# Patient Record
Sex: Female | Born: 1970
Health system: Southern US, Community
[De-identification: ages and names within clinical notes are randomized; demographics above are authoritative.]

## PROBLEM LIST (undated history)

## (undated) DIAGNOSIS — R112 Nausea with vomiting, unspecified: Secondary | ICD-10-CM

## (undated) DIAGNOSIS — R011 Cardiac murmur, unspecified: Secondary | ICD-10-CM

## (undated) DIAGNOSIS — K1379 Other lesions of oral mucosa: Secondary | ICD-10-CM

## (undated) DIAGNOSIS — M2550 Pain in unspecified joint: Secondary | ICD-10-CM

## (undated) DIAGNOSIS — R768 Other specified abnormal immunological findings in serum: Secondary | ICD-10-CM

## (undated) DIAGNOSIS — Z8619 Personal history of other infectious and parasitic diseases: Secondary | ICD-10-CM

## (undated) DIAGNOSIS — I7300 Raynaud's syndrome without gangrene: Secondary | ICD-10-CM

## (undated) DIAGNOSIS — N920 Excessive and frequent menstruation with regular cycle: Secondary | ICD-10-CM

## (undated) DIAGNOSIS — Z8701 Personal history of pneumonia (recurrent): Secondary | ICD-10-CM

## (undated) DIAGNOSIS — E785 Hyperlipidemia, unspecified: Secondary | ICD-10-CM

## (undated) DIAGNOSIS — Z9289 Personal history of other medical treatment: Secondary | ICD-10-CM

## (undated) DIAGNOSIS — N946 Dysmenorrhea, unspecified: Secondary | ICD-10-CM

## (undated) DIAGNOSIS — M329 Systemic lupus erythematosus, unspecified: Secondary | ICD-10-CM

## (undated) DIAGNOSIS — Z973 Presence of spectacles and contact lenses: Secondary | ICD-10-CM

## (undated) DIAGNOSIS — G8929 Other chronic pain: Secondary | ICD-10-CM

## (undated) DIAGNOSIS — N840 Polyp of corpus uteri: Secondary | ICD-10-CM

## (undated) DIAGNOSIS — Z8719 Personal history of other diseases of the digestive system: Secondary | ICD-10-CM

## (undated) DIAGNOSIS — Z8249 Family history of ischemic heart disease and other diseases of the circulatory system: Secondary | ICD-10-CM

## (undated) DIAGNOSIS — Z8709 Personal history of other diseases of the respiratory system: Secondary | ICD-10-CM

## (undated) DIAGNOSIS — R7689 Other specified abnormal immunological findings in serum: Secondary | ICD-10-CM

## (undated) DIAGNOSIS — Z9889 Other specified postprocedural states: Secondary | ICD-10-CM

## (undated) HISTORY — DX: Raynaud's syndrome without gangrene: I73.00

## (undated) HISTORY — PX: OTHER SURGICAL HISTORY: SHX169

---

## 1998-05-10 ENCOUNTER — Other Ambulatory Visit: Admission: RE | Admit: 1998-05-10 | Discharge: 1998-05-10 | Payer: Self-pay | Admitting: Gynecology

## 1998-12-29 ENCOUNTER — Other Ambulatory Visit: Admission: RE | Admit: 1998-12-29 | Discharge: 1998-12-29 | Payer: Self-pay | Admitting: Obstetrics and Gynecology

## 2001-01-17 ENCOUNTER — Other Ambulatory Visit: Admission: RE | Admit: 2001-01-17 | Discharge: 2001-01-17 | Payer: Self-pay | Admitting: Obstetrics and Gynecology

## 2001-05-27 ENCOUNTER — Ambulatory Visit (HOSPITAL_COMMUNITY): Admission: RE | Admit: 2001-05-27 | Discharge: 2001-05-27 | Payer: Self-pay | Admitting: Obstetrics and Gynecology

## 2001-06-30 ENCOUNTER — Encounter: Payer: Self-pay | Admitting: Obstetrics and Gynecology

## 2001-06-30 ENCOUNTER — Inpatient Hospital Stay (HOSPITAL_COMMUNITY): Admission: AD | Admit: 2001-06-30 | Discharge: 2001-06-30 | Payer: Self-pay | Admitting: Obstetrics and Gynecology

## 2001-08-04 ENCOUNTER — Inpatient Hospital Stay (HOSPITAL_COMMUNITY): Admission: AD | Admit: 2001-08-04 | Discharge: 2001-08-09 | Payer: Self-pay | Admitting: Obstetrics and Gynecology

## 2001-09-19 ENCOUNTER — Other Ambulatory Visit: Admission: RE | Admit: 2001-09-19 | Discharge: 2001-09-19 | Payer: Self-pay | Admitting: Obstetrics and Gynecology

## 2002-10-08 ENCOUNTER — Other Ambulatory Visit: Admission: RE | Admit: 2002-10-08 | Discharge: 2002-10-08 | Payer: Self-pay | Admitting: Obstetrics and Gynecology

## 2003-06-20 ENCOUNTER — Emergency Department (HOSPITAL_COMMUNITY): Admission: AD | Admit: 2003-06-20 | Discharge: 2003-06-20 | Payer: Self-pay | Admitting: Family Medicine

## 2003-06-22 ENCOUNTER — Ambulatory Visit (HOSPITAL_COMMUNITY): Admission: RE | Admit: 2003-06-22 | Discharge: 2003-06-22 | Payer: Self-pay | Admitting: Family Medicine

## 2004-11-29 ENCOUNTER — Other Ambulatory Visit: Admission: RE | Admit: 2004-11-29 | Discharge: 2004-11-29 | Payer: Self-pay | Admitting: Obstetrics and Gynecology

## 2005-11-28 ENCOUNTER — Ambulatory Visit (HOSPITAL_COMMUNITY): Admission: RE | Admit: 2005-11-28 | Discharge: 2005-11-28 | Payer: Self-pay | Admitting: Emergency Medicine

## 2005-11-28 ENCOUNTER — Emergency Department (HOSPITAL_COMMUNITY): Admission: EM | Admit: 2005-11-28 | Discharge: 2005-11-28 | Payer: Self-pay | Admitting: Emergency Medicine

## 2007-09-25 ENCOUNTER — Emergency Department (HOSPITAL_COMMUNITY): Admission: EM | Admit: 2007-09-25 | Discharge: 2007-09-25 | Payer: Self-pay | Admitting: Emergency Medicine

## 2009-03-14 ENCOUNTER — Encounter: Payer: Self-pay | Admitting: Pulmonary Disease

## 2009-03-24 ENCOUNTER — Ambulatory Visit: Payer: Self-pay | Admitting: Pulmonary Disease

## 2009-03-24 DIAGNOSIS — J209 Acute bronchitis, unspecified: Secondary | ICD-10-CM

## 2009-07-09 DIAGNOSIS — I7300 Raynaud's syndrome without gangrene: Secondary | ICD-10-CM

## 2009-07-09 HISTORY — DX: Raynaud's syndrome without gangrene: I73.00

## 2010-05-16 ENCOUNTER — Ambulatory Visit (HOSPITAL_COMMUNITY): Admission: RE | Admit: 2010-05-16 | Discharge: 2010-05-16 | Payer: Self-pay | Admitting: Family Medicine

## 2010-05-22 ENCOUNTER — Encounter (HOSPITAL_COMMUNITY)
Admission: RE | Admit: 2010-05-22 | Discharge: 2010-06-21 | Payer: Self-pay | Source: Home / Self Care | Attending: Family Medicine | Admitting: Family Medicine

## 2010-06-07 ENCOUNTER — Ambulatory Visit (HOSPITAL_COMMUNITY): Admission: RE | Admit: 2010-06-07 | Discharge: 2010-06-07 | Payer: Self-pay | Admitting: General Surgery

## 2010-06-07 HISTORY — PX: LAPAROSCOPIC CHOLECYSTECTOMY: SUR755

## 2010-07-14 ENCOUNTER — Ambulatory Visit (HOSPITAL_COMMUNITY)
Admission: RE | Admit: 2010-07-14 | Discharge: 2010-07-14 | Payer: Self-pay | Source: Home / Self Care | Attending: Rheumatology | Admitting: Rheumatology

## 2010-09-19 LAB — DIFFERENTIAL
Basophils Relative: 0 % (ref 0–1)
Eosinophils Absolute: 0.2 10*3/uL (ref 0.0–0.7)
Lymphs Abs: 3 10*3/uL (ref 0.7–4.0)
Monocytes Absolute: 0.6 10*3/uL (ref 0.1–1.0)
Monocytes Relative: 6 % (ref 3–12)

## 2010-09-19 LAB — BASIC METABOLIC PANEL
CO2: 28 mEq/L (ref 19–32)
Calcium: 9.4 mg/dL (ref 8.4–10.5)
Chloride: 105 mEq/L (ref 96–112)
GFR calc Af Amer: >60 mL/min (ref >60)
Glucose, Bld: 68 mg/dL — ABNORMAL LOW (ref 70–99)
Potassium: 4.1 mEq/L (ref 3.5–5.1)
Sodium: 140 mEq/L (ref 135–145)

## 2010-09-19 LAB — PROTIME-INR: Prothrombin Time: 12.5 seconds (ref 11.6–15.2)

## 2010-09-19 LAB — CBC
HCT: 38.9 % (ref 36.0–46.0)
Hemoglobin: 14.1 g/dL (ref 12.0–15.0)
MCH: 31.3 pg (ref 26.0–34.0)
MCHC: 36.2 g/dL — ABNORMAL HIGH (ref 30.0–36.0)
MCV: 86.3 fL (ref 78.0–100.0)
RBC: 4.51 MIL/uL (ref 3.87–5.11)

## 2010-09-19 LAB — SURGICAL PCR SCREEN
MRSA, PCR: NEGATIVE
Staphylococcus aureus: POSITIVE — AB

## 2010-11-13 ENCOUNTER — Institutional Professional Consult (permissible substitution): Payer: Self-pay | Admitting: Internal Medicine

## 2010-11-14 ENCOUNTER — Ambulatory Visit (INDEPENDENT_AMBULATORY_CARE_PROVIDER_SITE_OTHER): Payer: Self-pay | Admitting: Pulmonary Disease

## 2010-11-14 ENCOUNTER — Encounter: Payer: Self-pay | Admitting: Pulmonary Disease

## 2010-11-14 VITALS — BP 132/80 | HR 85 | Temp 98.5°F | Ht 67.0 in | Wt 240.8 lb

## 2010-11-14 DIAGNOSIS — R0609 Other forms of dyspnea: Secondary | ICD-10-CM

## 2010-11-14 DIAGNOSIS — R0989 Other specified symptoms and signs involving the circulatory and respiratory systems: Secondary | ICD-10-CM

## 2010-11-14 DIAGNOSIS — D6862 Lupus anticoagulant syndrome: Secondary | ICD-10-CM | POA: Insufficient documentation

## 2010-11-14 DIAGNOSIS — R0600 Dyspnea, unspecified: Secondary | ICD-10-CM | POA: Insufficient documentation

## 2010-11-14 NOTE — Assessment & Plan Note (Addendum)
I suspect the pts doe is related to her ongoing smoking and chronic airway inflammation.  She describes a day to day variability, and the ability to do anything on her good days.  The key for improvement will be smoking cessation.  Her obesity also plays a role in her symptoms.  That said, she has been diagnosed with autoimmune disease that may affect her lungs significantly.  I think she needs a v/q given her h/o lupus anticoagulant, full pfts to establish a baseline and to see if obstructive lung disease, cxr to r/o ISLD, and finally would consider an echo to evaluate for pulmonary htn.

## 2010-11-14 NOTE — Progress Notes (Signed)
  Subjective:    Patient ID: Jillian Cruz, female    DOB: [birth year], 40 y.o.   MRN: [id number]  HPI The pt is a 39y/o female who I have been asked to see for dyspnea.  She was seen in 2010 for an episode of acute asthmatic bronchitis related to her ongoing smoking, and unfortunately continues to do so.  She has been recently diagnosed with lupus, lupus anticoagulant, and possibly scleroderma (followed by Deveshwar).  She has been started on mtx and pred for this.  The pt continues to have issues with recurrent bronchitis, and notes a day to day variability in her dyspnea.  On some days she breathes well with excellent exertional tolerance, and others has chest tightness with significant doe.  She feels her breathing is no different from her last visit with me on "normal" days.  She can walk a very long distance without difficulty on her normal days.  She denies any LE edema, pleuritic cp, or h/o VTE.  She has not had a recent cxr.    Review of Systems  Constitutional: Positive for unexpected weight change. Negative for fever.  HENT: Negative for ear pain, nosebleeds, congestion, sore throat, rhinorrhea, sneezing, trouble swallowing, dental problem, postnasal drip and sinus pressure.   Eyes: Negative for redness and itching.  Respiratory: Positive for shortness of breath. Negative for cough and chest tightness.   Cardiovascular: Negative for palpitations and leg swelling.  Gastrointestinal: Negative for nausea and vomiting.  Genitourinary: Negative for dysuria.  Musculoskeletal: Positive for joint swelling.  Skin: Negative for rash.  Neurological: Negative for headaches.  Hematological: Does not bruise/bleed easily.  Psychiatric/Behavioral: Negative for dysphoric mood. The patient is not nervous/anxious.        Objective:   Physical Exam Obese female in nad Nares patent without discharge.  Mild crusting Op clear Chest totally clear to auscultation, no wheezing Cor with rrr LE without  edema, no cyanosis  Alert and oriented, moves all 4        Assessment & Plan:

## 2010-11-14 NOTE — Patient Instructions (Signed)
Will schedule for lung scan and cxr to evaluate for possible small blood clots Will schedule for breathing tests, and will all you with results Stop smoking.  This is critical given new diagnosis Work on weight loss Will arrange followup visit once testing done

## 2010-11-20 ENCOUNTER — Ambulatory Visit (HOSPITAL_COMMUNITY)
Admission: RE | Admit: 2010-11-20 | Discharge: 2010-11-20 | Disposition: A | Discharge status: Home / Self Care | Payer: 59 | Source: Ambulatory Visit | Attending: Pulmonary Disease | Admitting: Pulmonary Disease

## 2010-11-20 ENCOUNTER — Encounter (HOSPITAL_COMMUNITY)
Admission: RE | Admit: 2010-11-20 | Discharge: 2010-11-20 | Disposition: A | Discharge status: Home / Self Care | Payer: 59 | Source: Ambulatory Visit | Attending: Pulmonary Disease | Admitting: Pulmonary Disease

## 2010-11-20 ENCOUNTER — Other Ambulatory Visit (HOSPITAL_COMMUNITY): Payer: 59

## 2010-11-20 DIAGNOSIS — R0600 Dyspnea, unspecified: Secondary | ICD-10-CM

## 2010-11-20 DIAGNOSIS — F17200 Nicotine dependence, unspecified, uncomplicated: Secondary | ICD-10-CM | POA: Insufficient documentation

## 2010-11-20 DIAGNOSIS — R0609 Other forms of dyspnea: Secondary | ICD-10-CM | POA: Insufficient documentation

## 2010-11-20 DIAGNOSIS — R062 Wheezing: Secondary | ICD-10-CM | POA: Insufficient documentation

## 2010-11-20 DIAGNOSIS — R0989 Other specified symptoms and signs involving the circulatory and respiratory systems: Secondary | ICD-10-CM | POA: Insufficient documentation

## 2010-11-20 DIAGNOSIS — R0602 Shortness of breath: Secondary | ICD-10-CM | POA: Insufficient documentation

## 2010-11-20 DIAGNOSIS — M349 Systemic sclerosis, unspecified: Secondary | ICD-10-CM | POA: Insufficient documentation

## 2010-11-20 MED ORDER — XENON XE 133 GAS
5.5000 | GAS_FOR_INHALATION | Freq: Once | RESPIRATORY_TRACT | Status: AC | PRN
  Administered 2010-11-20: 6 via RESPIRATORY_TRACT

## 2010-11-20 MED ORDER — TECHNETIUM TO 99M ALBUMIN AGGREGATED
5.5000 | Freq: Once | INTRAVENOUS | Status: AC | PRN
  Administered 2010-11-20: 6 via INTRAVENOUS

## 2010-11-24 NOTE — Op Note (Signed)
[facility]  Patient:    Jillian Cruz, VENTURA Visit Number: [id number] MRN: [id number]          Service Type: OBS Location: 910A 9144 01 Attending Physician:  Marcelle Overlie Dictated by:   Trevor Iha, M.D. Proc. Date: 08/06/01 Admit Date:  08/04/2001                             Operative Report  PREOPERATIVE DIAGNOSES:       1. Intrauterine pregnancy at 39 weeks.                               2. Failed induction.                               3. Macrosomia by ultrasound.                               4. Desires primary low segment transverse                                  cesarean section.  POSTOPERATIVE DIAGNOSES:      1. Intrauterine pregnancy at 39 weeks.                               2. Failed induction.                               3. Macrosomia by ultrasound.                               4. Desires primary low segment transverse                                  cesarean section.  PROCEDURE:                    Primary low segment transverse cesarean section.  SURGEON:                      Trevor Iha, M.D.  ANESTHESIA:                   Spinal.  INDICATIONS:                  The patient is a 40 year old primigravida admitted on January 27 for two-stage induction due to 9 lb 1 oz infant by ultrasound.  She underwent Cytotec cervical ripening and Pitocin.  No cervical change.  She again had Cytotec cervical ripening.  Restarted Pitocin.  Again, no cervical change.  At this time, the patient was offered continued induction, primary cesarean section or even discharge to home with follow up in one week.  The patient and her husband adamantly desired primary cesarean section.  The risks and benefits were discussed and informed consent was obtained.  FINDINGS AT THE TIME OF SURGERY:          Viable female infant with Apgars of  9 and 9. Weight was 8 lb 3 oz.  DESCRIPTION OF PROCEDURE:     After adequate analgesia, the patient  was placed in the supine position with a left lateral tilt.  She was sterilely prepped and draped.  The bladder was sterilely drained with a Foley catheter.  A Pfannenstiel skin incision was made two fingerbreadths above the pubic symphysis and taken down sharply to the fascia.  The fascia was incised transversely and extended superiorly and inferiorly off the bellies of the rectus muscles.  The rectus muscles were separated sharply in the midline. The peritoneum was then entered sharply.  A bladder flap was created and place behind a bladder blade.  A low segment myotomy incision was made down to the infants vertex.  This was extended laterally with the operators fingertips. Amniotomy was performed.  The infants vertex was grasped and delivered with a vacuum extractor.  The nares and pharynx were suctioned.  The infant was then delivered and the cord clamped.  The infant was handed to the pediatricians. Cord blood was then obtained.  The placenta was extracted manually.  The uterus was exteriorized and wiped clean with a dry lap.  The myotomy incision was closed in two layers, the first being a running locking layer, the second being an imbricating layer.  After adequate hemostasis was assured, the uterus was placed back into the peritoneal cavity.  After a copious amount of irrigation, a small area in the myotomy incision required a figure-of-eight of 0 Monocryl for hemostasis.  Irrigation of the pelvis was applied.  After adequate hemostasis, the peritoneum was closed with 2-0 Vicryl in a running fashion.  The rectus muscles were plicated in the midline.  Irrigation was applied.  After adequate hemostasis, the fascia was closed in a single layer with 0 PDS.  Irrigation was applied.  After adequate hemostasis, the skin was stapled and Steri-Strips applied.  The patient tolerated the procedure well and was stable on transfer to the recovery room.  Sponge, needle and instrument counts was  normal x 3.  The patient received 1 g of cefotetan after delivery of the placenta.  Estimated blood loss was 550 cc. Dictated by:   Trevor Iha, M.D. Attending Physician:  Marcelle Overlie DD:  08/06/01 TD:  08/06/01 Job: [id number]

## 2010-11-24 NOTE — Discharge Summary (Signed)
[facility]  Patient:    Jillian Cruz, Jillian Cruz Visit Number: [id number] MRN: [id number]          Service Type: OBS Location: 910A 9144 01 Attending Physician:  Marcelle Overlie Dictated by:   Danie Chandler, R.N. Admit Date:  08/04/2001 Discharge Date: 08/09/2001                             Discharge Summary  ADMITTING DIAGNOSES:          1. Intrauterine pregnancy at [redacted] weeks gestation                                  with macrosomia by ultrasound for induction.  DISCHARGE DIAGNOSES:          1. Intrauterine pregnancy at [redacted] weeks gestation                                  with macrosomia by ultrasound for induction.                               2. Failed induction and desires primary low                                  segment transverse cesarean section.  PROCEDURE:                    On August 06, 2001, primary low segment transverse cesarean section.  REASON FOR ADMISSION:         The patient is a 40 year old primigravida who was admitted on January 27, for a two-stage induction due to a 9 pound 1 ounce infant by ultrasound.  The patient underwent Cytotec cervical ripening and Pitocin.  There was no cervical change.  She again had Cytotec cervical ripening.  The Pitocin was restarted.  Again, there was no cervical change. At that point, the patient was offered continued induction, primary cesarean section or even discharge to home with followup in one week.  The patient and her husband adamantly desired primary cesarean section.  HOSPITAL COURSE:              The patient was taken to the operating room and underwent the above-named procedure without complication.  This was productive of a viable female infant with Apgars of 9 at one minute and 9 at five minutes. Postoperatively, on day #1, the patient had good return of bowel function. She was without complaint.  Her hemoglobin was 9.0, hematocrit 25.8, and white blood cell count 11.3.  On  postoperative day #2, she had good pain control and was ambulating well without difficulty.  She was also tolerating a regular diet.  On postoperative day #3, she was discharged home.  CONDITION ON DISCHARGE:       Good.  DIET:                         Regular as tolerated.  ACTIVITY:                     No heavy lifting, no driving, no vaginal entry. She is to follow up in the  office in 1-2 weeks for incision check and she is to call for temperature greater than 100.0 degrees, persistent nausea or vomiting, heavy vaginal bleeding and/or redness or drainage from the incision site.  DISCHARGE MEDICATIONS:        1. Prenatal vitamin 1 p.o. q.d.                               2. Percocet 5 mg, #30, as directed by                                  medical doctor. Dictated by:   Danie Chandler, R.N. Attending Physician:  Marcelle Overlie DD:  08/21/01 TD:  08/21/01 Job: [id number]

## 2010-11-28 ENCOUNTER — Encounter: Payer: Self-pay | Admitting: Pulmonary Disease

## 2010-12-06 ENCOUNTER — Encounter: Payer: Self-pay | Admitting: Pulmonary Disease

## 2010-12-06 ENCOUNTER — Ambulatory Visit (INDEPENDENT_AMBULATORY_CARE_PROVIDER_SITE_OTHER): Payer: 59 | Admitting: Pulmonary Disease

## 2010-12-06 VITALS — BP 122/66 | HR 85 | Temp 98.4°F | Ht 67.0 in | Wt 239.0 lb

## 2010-12-06 DIAGNOSIS — R0600 Dyspnea, unspecified: Secondary | ICD-10-CM

## 2010-12-06 DIAGNOSIS — R0989 Other specified symptoms and signs involving the circulatory and respiratory systems: Secondary | ICD-10-CM

## 2010-12-06 LAB — PULMONARY FUNCTION TEST

## 2010-12-06 NOTE — Assessment & Plan Note (Signed)
The pt has doe with an isolated DLCO abnormality on pfts.  She has no evidence of chronic TE disease by v/q, and therefore needs echo to evaluate for pulmonary htn given her autoimmune disease.  If normal, would consider HRCT to r/o occult ISLD.  I have also stressed to her the importance of weight loss and smoking cessation.  I have told her that she does not have copd, but that she can get airway inflammation due to ongoing smoking that can interfere with her breathing as well.

## 2010-12-06 NOTE — Patient Instructions (Signed)
Will set up for sound wave test of heart to evaluate for pulmonary hypertension. Work on weight loss and smoking cessation.  Will call you with results from echo

## 2010-12-06 NOTE — Progress Notes (Signed)
  Subjective:    Patient ID: Jillian Cruz, female    DOB: 16-May-1971, 40 y.o.   MRN: [id number]  HPI The pt comes in today for f/u of her recent v/q scan and pfts, done as part of a w/u for doe.  Her v/q did not show any evidence for chronic PE, and her pfts were totally normal except an isolated DLCO abnl.  Her cxr did not show any evidence for ISLD.  I have reviewed the studies with her, and answered all of her questions.    Review of Systems  Constitutional: Negative for fever and unexpected weight change.  HENT: Negative for ear pain, nosebleeds, congestion, sore throat, rhinorrhea, sneezing, trouble swallowing, dental problem, postnasal drip and sinus pressure.   Eyes: Negative for redness and itching.  Respiratory: Negative for cough, chest tightness, shortness of breath and wheezing.   Cardiovascular: Positive for leg swelling. Negative for palpitations.  Gastrointestinal: Negative for nausea and vomiting.  Genitourinary: Negative for dysuria.  Musculoskeletal: Positive for joint swelling.  Skin: Negative for rash.  Neurological: Negative for headaches.  Hematological: Does not bruise/bleed easily.  Psychiatric/Behavioral: Negative for dysphoric mood. The patient is not nervous/anxious.        Objective:   Physical Exam Obese female in nad Chest clear to auscultation Cor with rrr LE without significant edema, no cyanosis  Alert, oriented, moves all 4        Assessment & Plan:

## 2010-12-06 NOTE — Progress Notes (Signed)
PFT done today. 

## 2010-12-08 ENCOUNTER — Ambulatory Visit (HOSPITAL_COMMUNITY): Payer: 59 | Attending: Pulmonary Disease | Admitting: Radiology

## 2010-12-08 DIAGNOSIS — R0600 Dyspnea, unspecified: Secondary | ICD-10-CM

## 2010-12-08 DIAGNOSIS — I059 Rheumatic mitral valve disease, unspecified: Secondary | ICD-10-CM | POA: Insufficient documentation

## 2010-12-08 DIAGNOSIS — R0602 Shortness of breath: Secondary | ICD-10-CM

## 2010-12-08 DIAGNOSIS — M7989 Other specified soft tissue disorders: Secondary | ICD-10-CM | POA: Insufficient documentation

## 2010-12-08 DIAGNOSIS — R0989 Other specified symptoms and signs involving the circulatory and respiratory systems: Secondary | ICD-10-CM | POA: Insufficient documentation

## 2010-12-08 DIAGNOSIS — F17200 Nicotine dependence, unspecified, uncomplicated: Secondary | ICD-10-CM | POA: Insufficient documentation

## 2010-12-08 DIAGNOSIS — R0609 Other forms of dyspnea: Secondary | ICD-10-CM | POA: Insufficient documentation

## 2010-12-08 HISTORY — PX: TRANSTHORACIC ECHOCARDIOGRAM: SHX275

## 2010-12-11 ENCOUNTER — Telehealth: Payer: Self-pay | Admitting: Pulmonary Disease

## 2010-12-11 NOTE — Telephone Encounter (Signed)
Pt calling for Echo results that were done on 6/1.  Please advise.  Thanks.

## 2010-12-12 NOTE — Telephone Encounter (Signed)
Will review and let pt know results.

## 2010-12-12 NOTE — Telephone Encounter (Signed)
Patient calling back for echo results  

## 2010-12-13 ENCOUNTER — Other Ambulatory Visit: Payer: Self-pay | Admitting: Pulmonary Disease

## 2010-12-13 DIAGNOSIS — R0600 Dyspnea, unspecified: Secondary | ICD-10-CM

## 2010-12-13 NOTE — Progress Notes (Signed)
Noted.  Will send order to [facility]

## 2010-12-19 ENCOUNTER — Ambulatory Visit (INDEPENDENT_AMBULATORY_CARE_PROVIDER_SITE_OTHER)
Admission: RE | Admit: 2010-12-19 | Discharge: 2010-12-19 | Disposition: A | Discharge status: Home / Self Care | Payer: 59 | Source: Ambulatory Visit | Attending: Pulmonary Disease | Admitting: Pulmonary Disease

## 2010-12-19 DIAGNOSIS — R0989 Other specified symptoms and signs involving the circulatory and respiratory systems: Secondary | ICD-10-CM

## 2010-12-19 DIAGNOSIS — R0600 Dyspnea, unspecified: Secondary | ICD-10-CM

## 2010-12-22 ENCOUNTER — Telehealth: Payer: Self-pay | Admitting: Pulmonary Disease

## 2010-12-22 NOTE — Telephone Encounter (Signed)
Spoke with pt and advised Jillian Cruz out of the office until Monday 6/18, but will forward him msg that she wants these results. Please advise thanks

## 2010-12-25 NOTE — Telephone Encounter (Signed)
Pt called again requesting same. Also c/o SOB since last night. "very anxious" re: what's going on. [phone]. Hazel Sams

## 2010-12-26 ENCOUNTER — Encounter: Payer: Self-pay | Admitting: Pulmonary Disease

## 2010-12-26 NOTE — Telephone Encounter (Signed)
Done.  See documentation.  

## 2010-12-26 NOTE — Telephone Encounter (Signed)
LMTCB- will forward back to [facility] so he can advise results.

## 2010-12-26 NOTE — Progress Notes (Signed)
This encounter was created in error - please disregard.

## 2010-12-26 NOTE — Telephone Encounter (Signed)
Pt returned call.  Pt states her "nerves are shot" and is requesting the results of her CT chest.  KC, please advise of results.

## 2011-01-04 ENCOUNTER — Encounter: Payer: Self-pay | Admitting: Pulmonary Disease

## 2011-02-08 ENCOUNTER — Telehealth: Payer: Self-pay | Admitting: Pulmonary Disease

## 2011-02-08 NOTE — Telephone Encounter (Signed)
Called spoke with patient who is requesting to know what her diagnosis is.  She states that when she spoke with [facility] she was told to quit smoking, but has been informed by her rheumatologist that he said she has COPD.  Pt is confused and requesting clarification.  Is also requesting to know her PFT values that were discussed at ov.  No mention of these in ov note.  Pt is aware KC is out of the office this afternoon and will return tomorrow and is okay with this.  KC please advise, thanks!

## 2011-02-09 NOTE — Telephone Encounter (Signed)
Let pt know that she has had a very thorough pulmonary evaluation, and there is no lung condition evident that is causing her sob.  She does NOT have COPD by her breathing tests!!  But she must stop smoking nevertheless.  That alone may help with her shortness of breath.   She does not have a pulmonary diagnosis.  If she remains confused about all of this, would be happy to see her for OV to discuss again.

## 2011-02-09 NOTE — Telephone Encounter (Signed)
Pt aware of kc's response and she is fine with this, pt states the rheumatologist had confused her and she says thank-you to dr clance for going back over this with her.

## 2011-02-09 NOTE — Telephone Encounter (Signed)
lmtcb

## 2011-04-02 LAB — CBC
Hemoglobin: 15.1 — ABNORMAL HIGH
MCHC: 35.6
MCV: 86.3
RBC: 4.91
RDW: 12.6

## 2011-04-02 LAB — DIFFERENTIAL
Basophils Absolute: 0.1
Basophils Relative: 1
Eosinophils Absolute: 0.2
Monocytes Absolute: 0.9
Monocytes Relative: 7
Neutro Abs: 8.8 — ABNORMAL HIGH
Neutrophils Relative %: 65

## 2011-04-02 LAB — URINALYSIS, ROUTINE W REFLEX MICROSCOPIC
Bilirubin Urine: NEGATIVE
Ketones, ur: NEGATIVE
Nitrite: NEGATIVE
Specific Gravity, Urine: 1.011
Urobilinogen, UA: 1

## 2011-04-02 LAB — I-STAT 8, (EC8 V) (CONVERTED LAB)
Acid-base deficit: 1
BUN: 6
Bicarbonate: 27.5 — ABNORMAL HIGH
Chloride: 106
Glucose, Bld: 64 — ABNORMAL LOW
HCT: 46
Potassium: 3.6
Sodium: 138
TCO2: 29

## 2011-04-02 LAB — POCT CARDIAC MARKERS
CKMB, poc: <1 — ABNORMAL LOW
Myoglobin, poc: 53.7
Troponin i, poc: <0.05

## 2012-08-25 ENCOUNTER — Other Ambulatory Visit: Payer: Self-pay | Admitting: Oral Surgery

## 2012-08-25 DIAGNOSIS — M26629 Arthralgia of temporomandibular joint, unspecified side: Secondary | ICD-10-CM

## 2012-08-27 ENCOUNTER — Encounter (INDEPENDENT_AMBULATORY_CARE_PROVIDER_SITE_OTHER): Payer: Self-pay | Admitting: *Deleted

## 2012-08-31 ENCOUNTER — Ambulatory Visit
Admission: RE | Admit: 2012-08-31 | Discharge: 2012-08-31 | Disposition: A | Discharge status: Home / Self Care | Payer: 59 | Source: Ambulatory Visit | Attending: Oral Surgery | Admitting: Oral Surgery

## 2012-09-01 ENCOUNTER — Encounter (INDEPENDENT_AMBULATORY_CARE_PROVIDER_SITE_OTHER): Payer: Self-pay | Admitting: *Deleted

## 2012-09-01 ENCOUNTER — Other Ambulatory Visit (INDEPENDENT_AMBULATORY_CARE_PROVIDER_SITE_OTHER): Payer: Self-pay | Admitting: *Deleted

## 2012-09-01 ENCOUNTER — Encounter (INDEPENDENT_AMBULATORY_CARE_PROVIDER_SITE_OTHER): Payer: Self-pay | Admitting: Internal Medicine

## 2012-09-01 ENCOUNTER — Ambulatory Visit (INDEPENDENT_AMBULATORY_CARE_PROVIDER_SITE_OTHER): Payer: 59 | Admitting: Internal Medicine

## 2012-09-01 VITALS — BP 130/66 | HR 80 | Temp 98.7°F | Ht 67.0 in | Wt 247.0 lb

## 2012-09-01 DIAGNOSIS — A048 Other specified bacterial intestinal infections: Secondary | ICD-10-CM | POA: Insufficient documentation

## 2012-09-01 NOTE — Patient Instructions (Addendum)
EGD with Dr. Mariana Single

## 2012-09-01 NOTE — Progress Notes (Signed)
Subjective:     Patient ID: Jillian Cruz, female   DOB: [birth year], 42 y.o.   MRN: [id number]  HPI Referred to our office by [facility]. Seen the end of January.  She tells me everytime she ate she would have nausea. Symptoms x 1 weeks.  Seen at [facility] and tested positive for H. Pylori. She was treated with Biaxin, Amoxicillin and PPI x 14 days. The nausea is better. There is no acid reflux.  Recently placed on Protonix. She tells me she is 80% better as far as the nausea is concerned. Appetite is good. No weight loss. No abdominal. She usually has a BM 1-2 a day. She alternates between constipation and diarrhea.  No melena or bright red rectal bleeding.  08/05/2012 H and H 13.8 and 40.7, Platelet ct 415. Tested positive for antibody for scleroderma. Hx Lupus.    CBC    Component Value Date/Time   WBC 8.9 06/06/2010 1510   RBC 4.51 06/06/2010 1510   HGB 14.1 06/06/2010 1510   HCT 38.9 06/06/2010 1510   PLT 372 06/06/2010 1510   MCV 86.3 06/06/2010 1510   MCH 31.3 06/06/2010 1510   MCHC 36.2* 06/06/2010 1510   RDW 12.2 06/06/2010 1510   LYMPHSABS 3.0 06/06/2010 1510   MONOABS 0.6 06/06/2010 1510   EOSABS 0.2 06/06/2010 1510   BASOSABS 0.0 06/06/2010 1510      05/16/2010 US abdomen: No acute findings. Echogenic liver consistent with fatty infiltration. 05/22/2010 HIDA scan: Normal biliary patency study. Low GB EF at 27%. Review of Systems see hpi Current Outpatient Prescriptions  Medication Sig Dispense Refill  . cholecalciferol (VITAMIN D) 1000 UNITS tablet Take 1,000 Units by mouth 2 (two) times daily before a meal.      . DULoxetine (CYMBALTA) 30 MG capsule Take 30 mg by mouth daily.      . hydroxychloroquine (PLAQUENIL) 200 MG tablet Take 200 mg by mouth 2 (two) times daily.        . pantoprazole (PROTONIX) 40 MG tablet Take 40 mg by mouth daily.       No current facility-administered medications for this visit.   Past Medical History  Diagnosis Date  . Lupus  2011  . Scleroderma 2011  . Raynaud disease 2011  No Known Allergies  Past Surgical History  Procedure Laterality Date  . Cesarean section  2003  . Gallbladder surgery  2011        Objective:   Physical Exam  Filed Vitals:   09/01/12 1504  BP: 130/66  Pulse: 80  Temp: 98.7 F (37.1 C)  Height: 5\' 7"  (1.702 m)  Weight: 247 lb (112.038 kg)   Alert and oriented. Skin warm and dry. Oral mucosa is moist.   . Sclera anicteric, conjunctivae is pink. Thyroid not enlarged. No cervical lymphadenopathy. Lungs clear. Heart regular rate and rhythm.  Abdomen is soft. Bowel sounds are positive. No hepatomegaly. No abdominal masses felt. No tenderness.  No edema to lower extremities.       Assessment:    H. Pylori +. Nausea and vomiting. PUD needs to be ruled out. Patient has been treated for H. Pylori.     Plan:    EGD with Dr. Karilyn Cota.The risks and benefits such as perforation, bleeding, and infection were reviewed with the patient and is agreeable.

## 2012-09-02 ENCOUNTER — Encounter (HOSPITAL_COMMUNITY): Payer: Self-pay | Admitting: Pharmacy Technician

## 2012-09-03 ENCOUNTER — Encounter (HOSPITAL_COMMUNITY)
Admission: RE | Disposition: A | Discharge status: Home / Self Care | Payer: Self-pay | Source: Ambulatory Visit | Attending: Internal Medicine

## 2012-09-03 ENCOUNTER — Encounter (HOSPITAL_COMMUNITY): Payer: Self-pay | Admitting: *Deleted

## 2012-09-03 ENCOUNTER — Ambulatory Visit (HOSPITAL_COMMUNITY)
Admission: RE | Admit: 2012-09-03 | Discharge: 2012-09-03 | Disposition: A | Discharge status: Home / Self Care | Payer: 59 | Source: Ambulatory Visit | Attending: Internal Medicine | Admitting: Internal Medicine

## 2012-09-03 DIAGNOSIS — K208 Other esophagitis: Secondary | ICD-10-CM

## 2012-09-03 DIAGNOSIS — R112 Nausea with vomiting, unspecified: Secondary | ICD-10-CM

## 2012-09-03 DIAGNOSIS — K2980 Duodenitis without bleeding: Secondary | ICD-10-CM | POA: Insufficient documentation

## 2012-09-03 DIAGNOSIS — K2100 Gastro-esophageal reflux disease with esophagitis, without bleeding: Secondary | ICD-10-CM | POA: Insufficient documentation

## 2012-09-03 DIAGNOSIS — K2960 Other gastritis without bleeding: Secondary | ICD-10-CM

## 2012-09-03 DIAGNOSIS — Z791 Long term (current) use of non-steroidal anti-inflammatories (NSAID): Secondary | ICD-10-CM

## 2012-09-03 HISTORY — PX: ESOPHAGOGASTRODUODENOSCOPY: SHX5428

## 2012-09-03 SURGERY — EGD (ESOPHAGOGASTRODUODENOSCOPY)
Anesthesia: Moderate Sedation

## 2012-09-03 MED ORDER — SODIUM CHLORIDE 0.45 % IV SOLN
INTRAVENOUS | Status: DC
  Administered 2012-09-03: 1000 mL via INTRAVENOUS

## 2012-09-03 MED ORDER — BUTAMBEN-TETRACAINE-BENZOCAINE 2-2-14 % EX AERO
INHALATION_SPRAY | CUTANEOUS | Status: DC | PRN
  Administered 2012-09-03: 2 via TOPICAL

## 2012-09-03 MED ORDER — MEPERIDINE HCL 50 MG/ML IJ SOLN
INTRAMUSCULAR | Status: AC
  Filled 2012-09-03: qty 1

## 2012-09-03 MED ORDER — MEPERIDINE HCL 25 MG/ML IJ SOLN
INTRAMUSCULAR | Status: DC | PRN
  Administered 2012-09-03 (×2): 25 mg via INTRAVENOUS

## 2012-09-03 MED ORDER — MIDAZOLAM HCL 5 MG/5ML IJ SOLN
INTRAMUSCULAR | Status: DC | PRN
  Administered 2012-09-03: 3 mg via INTRAVENOUS
  Administered 2012-09-03 (×2): 2 mg via INTRAVENOUS
  Administered 2012-09-03: 3 mg via INTRAVENOUS

## 2012-09-03 MED ORDER — STERILE WATER FOR IRRIGATION IR SOLN
Status: DC | PRN
  Administered 2012-09-03: 15:00:00

## 2012-09-03 MED ORDER — MIDAZOLAM HCL 5 MG/5ML IJ SOLN
INTRAMUSCULAR | Status: AC
  Filled 2012-09-03: qty 10

## 2012-09-03 NOTE — Op Note (Signed)
EGD PROCEDURE REPORT  PATIENT:  Jillian Cruz  MR#:  [id number] Birthdate:  January 27, 1971, 42 y.o., female Endoscopist:  Dr. Malissa Hippo, MD Referred By:  Dr. Colette Ribas, MD Procedure Date: 09/03/2012  Procedure:   EGD .     Indications: Patient is 42 year old Caucasian female who presents with few week history of postprandial nausea and had single episode of vomiting. She's been treated for H. pylori gastritis and is 50% better. She has had gallbladder removed previously. She has been taking a lot of OTC NSAIDs for arthritis secondary to lupus.               Informed Consent:  The risks, benefits, alternatives & imponderables which include, but are not limited to, bleeding, infection, perforation, drug reaction and potential missed lesion have been reviewed.  The potential for biopsy, lesion removal, esophageal dilation, etc. have also been discussed.  Questions have been answered.  All parties agreeable.  Please see history & physical in medical record for more information.  Medications:  Demerol 50 mg IV Versed 10 mg IV Cetacaine spray topically for oropharyngeal anesthesia  Description of procedure:  The endoscope was introduced through the mouth and advanced to the second portion of the duodenum without difficulty or limitations. The mucosal surfaces were surveyed very carefully during advancement of the scope and upon withdrawal.  Findings:  Esophagus:  Mucosa of the esophagus was normal. Two small erosions are noted at distal esophagus contiguous with the GE junction. GEJ:  39 cm Stomach:  Stomach was empty and distended very well with insufflation. Folds in the proximal stomach are normal. Examination mucosa at body, antrum, pyloric channel, angularis fundus and cardia was normal. Duodenum:  Few tiny punctate erosions noted involving bulbar mucosa. No ulcer crater was present. Post bulbar mucosa was normal.  Therapeutic/Diagnostic Maneuvers Performed:  None  Complications:   None  Impression: Erosive reflux esophagitis. Erosive bulbar duodenitis.  Recommendations:  Patient will continue antireflux measures and pantoprazole as before. Patient advised to keep Advil use to minimum. Progress report  in 4 weeks.  Tashi Andujo U  09/03/2012  3:53 PM  CC: Dr. Phillips Odor, Chancy Hurter, MD & Dr. Bonnetta Barry ref. provider found

## 2012-09-03 NOTE — H&P (Signed)
Jillian Cruz is an 42 y.o. female.   Chief Complaint: Patient is here for EGD. HPI: Patient is 42 year old Caucasian female with history of this presents with postprandial nausea and single episode of vomiting. She was recently treated for H. pylori gastritis and is 50% better. She denies dysphagia frequent heartburn epigastric pain melena or rectal bleeding. She has been taking OTC Advil anywhere from 9-12 tablets a day recently. She doesn't smoke cigarettes.  Past Medical History  Diagnosis Date  . Lupus 2011  . Scleroderma 2011  . Raynaud disease 2011    Past Surgical History  Procedure Laterality Date  . Cesarean section  2003  . Gallbladder surgery  2011    Family History  Problem Relation Age of Onset  . Heart disease Father   . Kidney cancer Father    Social History:  reports that she has been smoking Cigarettes.  She has a 25 pack-year smoking history. She does not have any smokeless tobacco history on file. She reports that she does not drink alcohol or use illicit drugs.  Allergies: No Known Allergies  Medications Prior to Admission  Medication Sig Dispense Refill  . cholecalciferol (VITAMIN D) 1000 UNITS tablet Take 1,000 Units by mouth 2 (two) times daily before a meal.      . DULoxetine (CYMBALTA) 30 MG capsule Take 30 mg by mouth daily.      . hydroxychloroquine (PLAQUENIL) 200 MG tablet Take 200 mg by mouth 2 (two) times daily.        . pantoprazole (PROTONIX) 20 MG tablet Take 20 mg by mouth daily.        No results found for this or any previous visit (from the past 48 hour(s)). No results found.  ROS  Blood pressure 133/74, pulse 84, temperature 98.2 F (36.8 C), temperature source Oral, resp. rate 16, last menstrual period 08/25/2012, SpO2 97.00%. Physical Exam  Constitutional: She appears well-developed and well-nourished.  HENT:  Mouth/Throat: Oropharynx is clear and moist.  Eyes: Conjunctivae are normal. No scleral icterus.  Neck: No thyromegaly  present.  Cardiovascular: Normal rate, regular rhythm and normal heart sounds.   No murmur heard. Respiratory: Effort normal and breath sounds normal.  GI: She exhibits no distension. There is no tenderness.  Musculoskeletal: She exhibits no edema.  Lymphadenopathy:    She has no cervical adenopathy.  Neurological: She is alert.  Skin: Skin is warm and dry.     Assessment/Plan Postprandial nausea. History of NSAID use. Diagnostic EGD.  [name] 09/03/2012, 3:26 PM

## 2012-09-05 ENCOUNTER — Encounter (HOSPITAL_COMMUNITY): Payer: Self-pay | Admitting: Internal Medicine

## 2012-10-07 ENCOUNTER — Encounter (INDEPENDENT_AMBULATORY_CARE_PROVIDER_SITE_OTHER): Payer: Self-pay

## 2013-01-23 ENCOUNTER — Emergency Department (INDEPENDENT_AMBULATORY_CARE_PROVIDER_SITE_OTHER): Payer: 59

## 2013-01-23 ENCOUNTER — Emergency Department (HOSPITAL_COMMUNITY)
Admission: EM | Admit: 2013-01-23 | Discharge: 2013-01-23 | Discharge status: Against Medical Advice | Payer: 59 | Source: Home / Self Care

## 2013-01-23 ENCOUNTER — Emergency Department (HOSPITAL_COMMUNITY)
Admission: EM | Admit: 2013-01-23 | Discharge: 2013-01-23 | Disposition: A | Discharge status: Home / Self Care | Payer: 59 | Attending: Emergency Medicine | Admitting: Emergency Medicine

## 2013-01-23 ENCOUNTER — Encounter (HOSPITAL_COMMUNITY): Payer: Self-pay | Admitting: Family Medicine

## 2013-01-23 ENCOUNTER — Encounter (HOSPITAL_COMMUNITY): Payer: Self-pay

## 2013-01-23 ENCOUNTER — Emergency Department (HOSPITAL_COMMUNITY): Payer: 59

## 2013-01-23 DIAGNOSIS — R0602 Shortness of breath: Secondary | ICD-10-CM | POA: Insufficient documentation

## 2013-01-23 DIAGNOSIS — R0789 Other chest pain: Secondary | ICD-10-CM | POA: Insufficient documentation

## 2013-01-23 DIAGNOSIS — R791 Abnormal coagulation profile: Secondary | ICD-10-CM

## 2013-01-23 DIAGNOSIS — F17200 Nicotine dependence, unspecified, uncomplicated: Secondary | ICD-10-CM | POA: Insufficient documentation

## 2013-01-23 DIAGNOSIS — D6859 Other primary thrombophilia: Secondary | ICD-10-CM | POA: Insufficient documentation

## 2013-01-23 DIAGNOSIS — Z8679 Personal history of other diseases of the circulatory system: Secondary | ICD-10-CM | POA: Insufficient documentation

## 2013-01-23 DIAGNOSIS — Z8739 Personal history of other diseases of the musculoskeletal system and connective tissue: Secondary | ICD-10-CM | POA: Insufficient documentation

## 2013-01-23 DIAGNOSIS — R05 Cough: Secondary | ICD-10-CM | POA: Insufficient documentation

## 2013-01-23 DIAGNOSIS — R059 Cough, unspecified: Secondary | ICD-10-CM | POA: Insufficient documentation

## 2013-01-23 DIAGNOSIS — R7989 Other specified abnormal findings of blood chemistry: Secondary | ICD-10-CM

## 2013-01-23 DIAGNOSIS — R079 Chest pain, unspecified: Secondary | ICD-10-CM

## 2013-01-23 DIAGNOSIS — M549 Dorsalgia, unspecified: Secondary | ICD-10-CM | POA: Insufficient documentation

## 2013-01-23 DIAGNOSIS — Z79899 Other long term (current) drug therapy: Secondary | ICD-10-CM | POA: Insufficient documentation

## 2013-01-23 LAB — POCT I-STAT, CHEM 8
BUN: 10 mg/dL (ref 6–23)
Calcium, Ion: 1.22 mmol/L (ref 1.12–1.23)
Chloride: 102 meq/L (ref 96–112)
Creatinine, Ser: 0.8 mg/dL (ref 0.50–1.10)
Glucose, Bld: 93 mg/dL (ref 70–99)
HCT: 43 % (ref 36.0–46.0)
Hemoglobin: 14.6 g/dL (ref 12.0–15.0)
Potassium: 4.2 meq/L (ref 3.5–5.1)
Sodium: 140 meq/L (ref 135–145)
TCO2: 26 mmol/L (ref 0–100)

## 2013-01-23 LAB — POCT URINALYSIS DIP (DEVICE)
Bilirubin Urine: NEGATIVE
Glucose, UA: NEGATIVE mg/dL
Hgb urine dipstick: NEGATIVE
Ketones, ur: NEGATIVE mg/dL
Leukocytes, UA: NEGATIVE
Nitrite: POSITIVE — AB
Protein, ur: NEGATIVE mg/dL
Specific Gravity, Urine: 1.015 (ref 1.005–1.030)
Urobilinogen, UA: 0.2 mg/dL (ref 0.0–1.0)
pH: 6 (ref 5.0–8.0)

## 2013-01-23 LAB — CBC WITH DIFFERENTIAL/PLATELET
HCT: 40.5 % (ref 36.0–46.0)
Hemoglobin: 14.4 g/dL (ref 12.0–15.0)
Lymphocytes Relative: 23 % (ref 12–46)
Lymphs Abs: 2.1 10*3/uL (ref 0.7–4.0)
Monocytes Absolute: 0.5 10*3/uL (ref 0.1–1.0)
Monocytes Relative: 6 % (ref 3–12)
Neutro Abs: 6.2 10*3/uL (ref 1.7–7.7)
Neutrophils Relative %: 68 % (ref 43–77)
RBC: 4.69 MIL/uL (ref 3.87–5.11)

## 2013-01-23 LAB — D-DIMER, QUANTITATIVE: D-Dimer, Quant: 1.39 ug/mL-FEU — ABNORMAL HIGH (ref 0.00–0.48)

## 2013-01-23 MED ORDER — IOHEXOL 350 MG/ML SOLN
100.0000 mL | Freq: Once | INTRAVENOUS | Status: AC | PRN
  Administered 2013-01-23: 100 mL via INTRAVENOUS

## 2013-01-23 NOTE — ED Notes (Signed)
Reported cough and pain in back x 1 week; history of pneumonia , bronchitis, lupus

## 2013-01-23 NOTE — ED Notes (Signed)
Per pt sts left sided chest pain going around to her back. sts coughing and SOB and numbness in mouth x 1 week. Sent here from [facility] with elevated D Dimer and chest x ray worrisome for blood clot.

## 2013-01-23 NOTE — ED Provider Notes (Signed)
History/physical exam/procedure(s) were performed by non-physician practitioner and as supervising physician I was immediately available for consultation/collaboration. I have reviewed all notes and am in agreement with care and plan.   Eilleen Davoli S Lauran Romanski, MD 01/23/13 1906 

## 2013-01-23 NOTE — ED Provider Notes (Signed)
History    CSN: [id number] Arrival date & time 01/23/13  1104  First MD Initiated Contact with Patient 01/23/13 1134     Chief Complaint  Patient presents with  . Chest Pain   (Consider location/radiation/quality/duration/timing/severity/associated sxs/prior Treatment) HPI Comments: Patient with history of lupus, positive lupus anticoagulant, scleroderma -- presents with complaint of back pain, shortness of breath. Over the past 3 or 4 days the patient has awakened in the middle of the night with sharp left-sided back pain, shortness of breath that would gradually resolve. She also reports a persistent dry cough for the past week. She does not have a history of blood clots, recent immobilizations, recent surgery. She denies estrogen use. She denies dyspnea with exertion. She denies recent illness including fever, cold symptoms, nausea or vomiting, diarrhea, urinary symptoms. The onset of this condition was acute. The course is waxing and waning. Aggravating factors: none. Alleviating factors: none.    Patient is a 42 y.o. female presenting with chest pain. The history is provided by the patient and medical records.  Chest Pain Associated symptoms: back pain, cough and shortness of breath   Associated symptoms: no abdominal pain, no fever, no headache, no nausea and not vomiting    Past Medical History  Diagnosis Date  . Scleroderma 2011  . Raynaud disease 2011  . Lupus anticoagulant disorder    Past Surgical History  Procedure Laterality Date  . Cesarean section  2003  . Gallbladder surgery  2011  . Esophagogastroduodenoscopy N/A 09/03/2012    Procedure: ESOPHAGOGASTRODUODENOSCOPY (EGD);  Surgeon: Malissa Hippo, MD;  Location: [facility];  Service: Endoscopy;  Laterality: N/A;  325   Family History  Problem Relation Age of Onset  . Heart disease Father   . Kidney cancer Father    History  Substance Use Topics  . Smoking status: Current Every Day Smoker -- 1.00 packs/day  for 25 years    Types: Cigarettes  . Smokeless tobacco: Not on file  . Alcohol Use: No   OB History   Grav Para Term Preterm Abortions TAB SAB Ect Mult Living                 Review of Systems  Constitutional: Negative for fever.  HENT: Negative for sore throat and rhinorrhea.   Eyes: Negative for redness.  Respiratory: Positive for cough and shortness of breath.   Cardiovascular: Positive for chest pain. Negative for leg swelling.  Gastrointestinal: Negative for nausea, vomiting, abdominal pain and diarrhea.  Genitourinary: Negative for dysuria.  Musculoskeletal: Positive for back pain. Negative for myalgias.  Skin: Negative for rash.  Neurological: Negative for headaches.    Allergies  Review of patient's allergies indicates no known allergies.  Home Medications   Current Outpatient Rx  Name  Route  Sig  Dispense  Refill  . cholecalciferol (VITAMIN D) 1000 UNITS tablet   Oral   Take 1,000 Units by mouth 2 (two) times daily before a meal.         . DULoxetine (CYMBALTA) 30 MG capsule   Oral   Take 30 mg by mouth daily.         . hydroxychloroquine (PLAQUENIL) 200 MG tablet   Oral   Take 200 mg by mouth 2 (two) times daily.           . pantoprazole (PROTONIX) 20 MG tablet   Oral   Take 20 mg by mouth daily.          BP  114/62  Pulse 85  Temp(Src) 98 F (36.7 C)  Resp 18  SpO2 97%  LMP 12/29/2012 Physical Exam  Nursing note and vitals reviewed. Constitutional: She appears well-developed and well-nourished.  HENT:  Head: Normocephalic and atraumatic.  Eyes: Conjunctivae are normal. Right eye exhibits no discharge. Left eye exhibits no discharge.  Neck: Normal range of motion. Neck supple.  Cardiovascular: Normal rate, regular rhythm and normal heart sounds.   Pulmonary/Chest: Effort normal and breath sounds normal. No respiratory distress. She has no wheezes. She has no rales.  Abdominal: Soft. There is no tenderness.  Neurological: She is alert.   Skin: Skin is warm and dry.  Psychiatric: She has a normal mood and affect.    ED Course  Procedures (including critical care time) Labs Reviewed - No data to display Ct Angio Chest Pe W/cm &/or Wo Cm  01/23/2013   *RADIOLOGY REPORT*  Clinical Data: Chest pain and elevated D-dimer  CT ANGIOGRAPHY CHEST  Technique:  Multidetector CT imaging of the chest using the standard protocol during bolus administration of intravenous contrast. Multiplanar reconstructed images including MIPs were obtained and reviewed to evaluate the vascular anatomy.  Contrast: OMNIPAQUE IOHEXOL 350 MG/ML SOLN  Comparison: 12/19/2010  Findings: There is a small left pleural effusion identified.  There is mild interlobular septal thickening identified.  No airspace consolidation identified.  No suspicious pulmonary parenchymal nodule or mass identified.  There is mild cardiac enlargement.  No pericardial effusion.  Right paratracheal lymph node measures 9 mm, image 22/series 5.  There is a subcarinal lymph node which measures 1.3 cm, image 37/series 5. Within the left hilar region there is a 1.2 cm lymph node, image 38/series 5.  Within the right hilar region there is a 1.2 cm lymph node, image 33/series 5.  No abnormal filling defects are identified within the main pulmonary artery or its branches to suggest an acute pulmonary embolus.  Limited imaging through the upper abdomen shows clips from prior cholecystectomy.  The adrenal glands both appear normal.  Review of the visualized bony structures is significant for mild spondylosis within the thoracic spine.  IMPRESSION:  1.  No evidence for acute pulmonary embolus. 2.  There is a nonspecific enlargement of the mediastinal and hilar lymph nodes.  This may be reactive in etiology.  Differential considerations include granulomatous inflammation or infection, lymphoma or metastatic adenopathy. 3.  Small pleural effusion and mild interstitial edema.   Original Report Authenticated  By: Signa Kell, M.D.   Dg Abd Acute W/chest  01/23/2013   *RADIOLOGY REPORT*  Clinical Data: Cough, chest pain, history of pneumonia and bronchitis  ACUTE ABDOMEN SERIES (ABDOMEN 2 VIEW & CHEST 1 VIEW)  Comparison: 11/20/2010; chest CT - 12/19/2010  Findings:  Grossly unchanged cardiac silhouette and mediastinal contours given slightly reduced lung volumes.  Minimal bibasilar atelectasis.  No focal airspace opacities.  No pleural effusion or pneumothorax.  No definite evidence of edema.  There is a paucity of bowel gas without evidence of obstruction. Moderate colonic stool burden.  No pneumoperitoneum, pneumatosis or portal venous gas.  Post cholecystectomy.  No acute osseous abnormalities.  IMPRESSION: 1.  Minimal bibasilar atelectasis without acute cardiopulmonary disease. 2.  Moderate colonic stool burden without evidence of obstruction.   Original Report Authenticated By: Tacey Ruiz, MD   1. Chest pain     1:17 PM Patient seen and examined. Work-up initiated.    Vital signs reviewed and are as follows: Filed Vitals:   01/23/13 1302  BP: 114/62  Pulse:   Temp:   Resp: 18  BP 114/62  Pulse 85  Temp(Src) 98 F (36.7 C)  Resp 18  SpO2 97%  LMP 12/29/2012'  CT was negative for PE. Patient informed of involvement of lymph nodes and I have asked that she review these findings and monitor with her PCP. Patient verbalizes understanding and agrees with plan.   Patient was counseled to return with severe chest pain, especially if the pain is crushing or pressure-like and spreads to the arms, back, neck, or jaw, or if they have sweating, nausea, or shortness of breath with the pain. They were encouraged to call 911 with these symptoms.   They were also told to return if their chest pain gets worse and does not go away with rest, they have an attack of chest pain lasting longer than usual despite rest and treatment with the medications their caregiver has prescribed, if they wake from sleep  with chest pain or shortness of breath, if they feel dizzy or faint, if they have chest pain not typical of their usual pain, or if they have any other emergent concerns regarding their health.  The patient verbalized understanding and agreed.    MDM  Patient with lupus, + lupus anticoagulant with intermittent CP/SOB. Concern for PE given elevated d-dimer. CT performed is neg. Patient appears well. No resp distress. PCP f/u indicated. She wants to use ibuprofen for pain. No concerning or life threatening conditions suspected.   Renne Crigler, PA-C 01/23/13 1806

## 2013-01-23 NOTE — ED Notes (Signed)
Patient signed out AMA; will wak=lk to ED. As to smoke; attempted to call ED to advise

## 2013-01-23 NOTE — ED Provider Notes (Signed)
Medical screening examination/treatment/procedure(s) were performed by a resident physician or non-physician practitioner and as the supervising physician I was immediately available for consultation/collaboration.  Brittnee Gaetano, MD   Cephus Tupy S Haywood Meinders, MD 01/23/13 1840 

## 2013-01-23 NOTE — ED Provider Notes (Signed)
History    CSN: [id number] Arrival date & time 01/23/13  4098  First MD Initiated Contact with Patient 01/23/13 [phone]     Chief Complaint  Patient presents with  . Cough   (Consider location/radiation/quality/duration/timing/severity/associated sxs/prior Treatment) HPI Comments: 42 year old female presents complaining of dry cough for one week and left flank pain for 4 days. The cough has been dry, nonproductive. There is no shortness of breath or chest pain. No swelling of legs. For the past 4 nights, she has woken up in the middle of the night with severe, searing pain in her left mid back. The pain is sharp and nonradiating. It gradually recedes over about an hour and she is then able to go back to sleep. She says that at night when she wakes up with this pain, the entire area is swollen, red, and very tender to touch. She also says that when she wakes up with this pain she is panting and can hear a rattling in her lungs. She has never experienced anything like this before. No history of kidney stones or renal dysfunction. She does not feel sick and denies any fever, chills, NVD, hematuria, dysuria, hemoptysis. She does have a history of lupus and lupus anticoagulant syndrome,  Possibly scleroderma as well. She has recently been treated for H. pyloric gastritis. She has no history of DVT or PE. Right now, she is having no pain at all and is completely without any complaints except for the mild, dry, infrequent cough  Patient is a 42 y.o. female presenting with cough.  Cough Associated symptoms: no chest pain, no chills, no ear pain, no eye discharge, no fever, no myalgias, no rash, no rhinorrhea, no shortness of breath, no sore throat and no wheezing    Past Medical History  Diagnosis Date  . Scleroderma 2011  . Raynaud disease 2011  . Lupus anticoagulant disorder    Past Surgical History  Procedure Laterality Date  . Cesarean section  2003  . Gallbladder surgery  2011  .  Esophagogastroduodenoscopy N/A 09/03/2012    Procedure: ESOPHAGOGASTRODUODENOSCOPY (EGD);  Surgeon: Malissa Hippo, MD;  Location: [facility];  Service: Endoscopy;  Laterality: N/A;  325   Family History  Problem Relation Age of Onset  . Heart disease Father   . Kidney cancer Father    History  Substance Use Topics  . Smoking status: Current Every Day Smoker -- 1.00 packs/day for 25 years    Types: Cigarettes  . Smokeless tobacco: Not on file  . Alcohol Use: No   OB History   Grav Para Term Preterm Abortions TAB SAB Ect Mult Living                 Review of Systems  Constitutional: Negative for fever and chills.  HENT: Negative for ear pain, congestion, sore throat, facial swelling, rhinorrhea, sneezing, neck pain, neck stiffness, postnasal drip and sinus pressure.   Eyes: Negative for photophobia, discharge, redness and visual disturbance.  Respiratory: Positive for cough. Negative for choking, chest tightness, shortness of breath and wheezing.   Cardiovascular: Negative for chest pain, palpitations and leg swelling.  Gastrointestinal: Negative for nausea, vomiting, abdominal pain, diarrhea, constipation and blood in stool.  Endocrine: Negative for polydipsia and polyuria.  Genitourinary: Positive for flank pain. Negative for dysuria, urgency, frequency, hematuria, decreased urine volume, vaginal bleeding, vaginal discharge, difficulty urinating, vaginal pain and pelvic pain.  Musculoskeletal: Positive for back pain. Negative for myalgias and arthralgias.  Skin: Negative for pallor  and rash.  Neurological: Negative for dizziness, weakness and light-headedness.    Allergies  Review of patient's allergies indicates no known allergies.  Home Medications   Current Outpatient Rx  Name  Route  Sig  Dispense  Refill  . DULoxetine (CYMBALTA) 30 MG capsule   Oral   Take 30 mg by mouth daily.         . hydroxychloroquine (PLAQUENIL) 200 MG tablet   Oral   Take 200 mg by  mouth 2 (two) times daily.           . cholecalciferol (VITAMIN D) 1000 UNITS tablet   Oral   Take 1,000 Units by mouth 2 (two) times daily before a meal.         . pantoprazole (PROTONIX) 20 MG tablet   Oral   Take 20 mg by mouth daily.          BP 135/68  Pulse 84  Temp(Src) 98.3 F (36.8 C) (Oral)  Resp 16  SpO2 100%  LMP 12/29/2012 Physical Exam  Nursing note and vitals reviewed. Constitutional: She is oriented to person, place, and time. Vital signs are normal. She appears well-developed and well-nourished. No distress.  HENT:  Head: Normocephalic and atraumatic.  Mouth/Throat: No oropharyngeal exudate.  Eyes: EOM are normal. Pupils are equal, round, and reactive to light.  Neck: Normal range of motion. Neck supple. No thyromegaly present.  Cardiovascular: Normal rate, regular rhythm and normal heart sounds.  Exam reveals no gallop and no friction rub.   No murmur heard. Pulmonary/Chest: Effort normal and breath sounds normal. No respiratory distress. She has no wheezes. She has no rales.  Abdominal: Soft. Bowel sounds are normal. She exhibits no distension and no mass. There is no hepatosplenomegaly. There is no tenderness. There is no rebound, no guarding and no CVA tenderness (there is swelling over the left costovertebral angle without any erythema or tenderness). No hernia.  Lymphadenopathy:    She has no cervical adenopathy.  Neurological: She is alert and oriented to person, place, and time. She has normal strength.  Skin: Skin is warm and dry. She is not diaphoretic.  Psychiatric: She has a normal mood and affect. Her behavior is normal. Judgment normal.    ED Course  Procedures (including critical care time) Labs Reviewed  D-DIMER, QUANTITATIVE - Abnormal; Notable for the following:    D-Dimer, Quant 1.39 (*)    All other components within normal limits  POCT URINALYSIS DIP (DEVICE) - Abnormal; Notable for the following:    Nitrite POSITIVE (*)    All  other components within normal limits  CBC WITH DIFFERENTIAL  PRO B NATRIURETIC PEPTIDE  POCT I-STAT, CHEM 8   Dg Abd Acute W/chest  01/23/2013   *RADIOLOGY REPORT*  Clinical Data: Cough, chest pain, history of pneumonia and bronchitis  ACUTE ABDOMEN SERIES (ABDOMEN 2 VIEW & CHEST 1 VIEW)  Comparison: 11/20/2010; chest CT - 12/19/2010  Findings:  Grossly unchanged cardiac silhouette and mediastinal contours given slightly reduced lung volumes.  Minimal bibasilar atelectasis.  No focal airspace opacities.  No pleural effusion or pneumothorax.  No definite evidence of edema.  There is a paucity of bowel gas without evidence of obstruction. Moderate colonic stool burden.  No pneumoperitoneum, pneumatosis or portal venous gas.  Post cholecystectomy.  No acute osseous abnormalities.  IMPRESSION: 1.  Minimal bibasilar atelectasis without acute cardiopulmonary disease. 2.  Moderate colonic stool burden without evidence of obstruction.   Original Report Authenticated By: Tacey Ruiz,  MD   1. Cough   2. Back pain   3. Positive D dimer   4. Hypercoagulopathy     MDM  This patient is in a very hypercoagulable state with her lupus and lupus anticoagulant syndrome. She has a cough and a positive d-dimer. Discussed transfer to the emergency department to rule out PE. Would like to bring her down the shuttle to avoid any strain on her cardiovascular system but she refuses, she insists she will walk so that she can smoke a cigarette on the way there. She will sign out AMA  Graylon Good, PA-C 01/23/13 1035

## 2013-05-18 ENCOUNTER — Ambulatory Visit (INDEPENDENT_AMBULATORY_CARE_PROVIDER_SITE_OTHER): Payer: 59 | Admitting: Internal Medicine

## 2013-05-18 ENCOUNTER — Encounter: Payer: Self-pay | Admitting: Internal Medicine

## 2013-05-18 VITALS — BP 128/60 | HR 86 | Ht 67.0 in | Wt 252.8 lb

## 2013-05-18 DIAGNOSIS — R0609 Other forms of dyspnea: Secondary | ICD-10-CM

## 2013-05-18 DIAGNOSIS — E785 Hyperlipidemia, unspecified: Secondary | ICD-10-CM | POA: Insufficient documentation

## 2013-05-18 DIAGNOSIS — Z79899 Other long term (current) drug therapy: Secondary | ICD-10-CM

## 2013-05-18 DIAGNOSIS — E669 Obesity, unspecified: Secondary | ICD-10-CM | POA: Insufficient documentation

## 2013-05-18 DIAGNOSIS — M329 Systemic lupus erythematosus, unspecified: Secondary | ICD-10-CM | POA: Insufficient documentation

## 2013-05-18 DIAGNOSIS — E782 Mixed hyperlipidemia: Secondary | ICD-10-CM

## 2013-05-18 DIAGNOSIS — R0600 Dyspnea, unspecified: Secondary | ICD-10-CM

## 2013-05-18 DIAGNOSIS — R079 Chest pain, unspecified: Secondary | ICD-10-CM

## 2013-05-18 DIAGNOSIS — F17200 Nicotine dependence, unspecified, uncomplicated: Secondary | ICD-10-CM

## 2013-05-18 NOTE — Progress Notes (Signed)
OFFICE NOTE  Chief Complaint:  Re-establish cardiac care  Primary Care Physician: Colette Ribas, MD  HPI:  Jillian Cruz is a pleasant 42 year old female who was seen in the distant past by Dr. Domingo Sep in 2009, but is new to me. She is a strong family history of heart disease in a long time smoking habit. In addition she is obese and has recently been diagnosed with systemic lupus. She has been undergoing treatment at [facility] with [facility] and Plaquenil.  She is also taking prednisone 20 mg daily.  She was previously taking Zocor for dyslipidemia and in the past and had elevated cholesterol of greater than 225. She underwent a exercise Myoview in 2009 which showed a fixed anterior defect consistent with breast attenuation artifact. Her exercise capacity was limited and blood pressure was remarkably elevated to 223/84.  Her main complaint today has to do with swollen lymph nodes in her lungs which she says causes some pain across her back. She's also had some lower extremity swelling.    PMHx:  Past Medical History  Diagnosis Date  . Scleroderma 2011  . Raynaud disease 2011  . Lupus anticoagulant disorder     Past Surgical History  Procedure Laterality Date  . Cesarean section  2003  . Gallbladder surgery  2011  . Esophagogastroduodenoscopy N/A 09/03/2012    Procedure: ESOPHAGOGASTRODUODENOSCOPY (EGD);  Surgeon: Malissa Hippo, MD;  Location: [facility];  Service: Endoscopy;  Laterality: N/A;  325    FAMHx:  Family History  Problem Relation Age of Onset  . Heart disease Father   . Kidney cancer Father     SOCHx:   reports that she has been smoking Cigarettes.  She has a 25 pack-year smoking history. She does not have any smokeless tobacco history on file. She reports that she does not drink alcohol or use illicit drugs.  ALLERGIES:  No Known Allergies  ROS: A comprehensive review of systems was negative except for: Respiratory: positive for pleuritic  pain Allergic/Immunologic: positive for SLE  HOME MEDS: Current Outpatient Prescriptions  Medication Sig Dispense Refill  . Belimumab (BENLYSTA IV) Inject into the vein. Every 28 days      . cholecalciferol (VITAMIN D) 1000 UNITS tablet Take 1,000 Units by mouth daily.       . DULoxetine (CYMBALTA) 60 MG capsule Take 60 mg by mouth daily.      . hydroxychloroquine (PLAQUENIL) 200 MG tablet Take 200 mg by mouth 2 (two) times daily.        Marland Kitchen ibuprofen (ADVIL,MOTRIN) 200 MG tablet Take 400 mg by mouth 3 (three) times daily as needed for pain (pain for lupus).      . predniSONE (DELTASONE) 10 MG tablet Take 20 mg by mouth. IF NEEDED CAN TAKE AN EXTRA TABLET       No current facility-administered medications for this visit.    LABS/IMAGING: No results found for this or any previous visit (from the past 48 hour(s)). No results found.  VITALS: BP 128/60  Pulse 86  Ht 5\' 7"  (1.702 m)  Wt 252 lb 12.8 oz (114.669 kg)  BMI 39.58 kg/m2  EXAM: General appearance: alert and no distress Neck: no carotid bruit and no JVD Lungs: clear to auscultation bilaterally Heart: regular rate and rhythm, S1, S2 normal, no murmur, click, rub or gallop Abdomen: soft, non-tender; bowel sounds normal; no masses,  no organomegaly Extremities: extremities normal, atraumatic, no cyanosis or edema Pulses: 2+ and symmetric Skin: Skin  color, texture, turgor normal. No rashes or lesions Neurologic: Grossly normal Psych: Normal mood, affect  EKG: Normal sinus rhythm at 86  ASSESSMENT: 1. Tobacco dependence 2. Systemic lupus erythematosus 3. Family history of coronary disease 4. Dyslipidemia 5. Obesity (near morbid obesity)  PLAN: 1.   Jillian Cruz has numerous cardiac risk factors including long-standing tobacco use, near morbid obesity, dyslipidemia and a family history of coronary disease. She has lupus and is on medications for that. This is considered a coronary risk equivalent and confers as much risk  for more than diabetes. Given this fact, I think we need to be aggressive about lipid management. She was previously on cholesterol medications however 3 test her lipid profile including particle numbers. If these are significantly elevated I would work to get her LDL particle number less than 1000 and LDL C. less than 70.  I also spent more than 5 minutes today counseling her on smoking cessation and ways that she could go about doing that. Previously she tried Chantix but had nightmares with the medication.  She reports that she's not interested in quitting at this time.  Plan followup in 6 months to year - I will add cholesterol medication based on her numbers.  Chrystie Nose, MD, [facility] Attending Cardiologist [facility]  HILTY,Kenneth C 05/18/2013, 5:24 PM

## 2013-05-18 NOTE — Patient Instructions (Signed)
Your physician wants you to follow-up in 12 months Dr Rennis Golden.  You will receive a reminder letter in the mail two months in advance. If you don't receive a letter, please call our office to schedule the follow-up appointment.  LABS NMR with lipids,CMP

## 2013-10-12 ENCOUNTER — Encounter (INDEPENDENT_AMBULATORY_CARE_PROVIDER_SITE_OTHER): Payer: Self-pay

## 2013-10-12 ENCOUNTER — Encounter: Payer: Self-pay | Admitting: Pulmonary Disease

## 2013-10-12 ENCOUNTER — Ambulatory Visit (INDEPENDENT_AMBULATORY_CARE_PROVIDER_SITE_OTHER): Payer: 59 | Admitting: Pulmonary Disease

## 2013-10-12 VITALS — BP 122/84 | HR 97 | Temp 98.4°F | Ht 67.0 in | Wt 248.8 lb

## 2013-10-12 DIAGNOSIS — R0989 Other specified symptoms and signs involving the circulatory and respiratory systems: Secondary | ICD-10-CM

## 2013-10-12 DIAGNOSIS — R0609 Other forms of dyspnea: Secondary | ICD-10-CM

## 2013-10-12 DIAGNOSIS — R0600 Dyspnea, unspecified: Secondary | ICD-10-CM

## 2013-10-12 NOTE — Patient Instructions (Signed)
Keep working on total smoking cessation, as well as weight loss and conditioning. Please call me if worsening shortness of breath above usual baseline.

## 2013-10-12 NOTE — Progress Notes (Signed)
   Subjective:    Patient ID: Jillian Cruz, female    DOB: 27-Dec-1970, 43 y.o.   MRN: [id number]  HPI The patient comes in today for a pulmonary checkup. She was last seen in 2012 dyspnea on exertion and pleuritic chest pain. She has a known autoimmune disease, and is being [facility] by rheumatology. She is being tried on various immunosuppressive agents to see what will control her pleurisy the best. Her last CT chest was last summer which showed no interstitial disease or pulmonary emboli. She did have minimal mediastinal lymphadenopathy that is very common in only in disease. The patient has chronic dyspnea on exertion, but it is no different than 2012. Unfortunately she continues to smoke, and feels that she is not ready to quit.   Review of Systems  Constitutional: Negative for fever and unexpected weight change.  HENT: Negative for congestion, dental problem, ear pain, nosebleeds, postnasal drip, rhinorrhea, sinus pressure, sneezing, sore throat and trouble swallowing.   Eyes: Negative for redness and itching.  Respiratory: Positive for cough ( dry) and shortness of breath. Negative for chest tightness and wheezing.   Cardiovascular: Positive for chest pain ( with inspiration // left sided chest and shoulder pain). Negative for palpitations and leg swelling.  Gastrointestinal: Negative for nausea and vomiting.  Genitourinary: Negative for dysuria.  Musculoskeletal: Negative for joint swelling.  Skin: Negative for rash.  Neurological: Negative for headaches.  Hematological: Does not bruise/bleed easily.  Psychiatric/Behavioral: Negative for dysphoric mood. The patient is not nervous/anxious.        Objective:   Physical Exam Obese female in no acute distress Nose without purulence or discharge noted Neck without lymphadenopathy or thyromegaly Chest totally clear to auscultation, with no wheezes or rubs. Cardiac exam with regular rate and rhythm, 2/6 systolic  murmur Lower extremities with minimal edema, no cyanosis Alert and oriented, moves all 4 extremities.       Assessment & Plan:

## 2013-10-12 NOTE — Assessment & Plan Note (Signed)
The patient has chronic dyspnea on exertion, but she does not feel it is any worse now than it was at her last visit in 2012. With her ongoing smoking, she certainly is at risk for developing COPD, although she had no airflow obstruction in 2012. She is also at risk for pulmonary emboli, but her scan in July of last year did not show this. It also did not show any evidence for interstitial disease, and she has no crackles on lung exam today. Cardiology is following her closely, and can monitor for pulmonary hypertension. Her CT last summer did show minimal lymphadenopathy, but this is a frequent finding in autoimmune disease. Regarding her chronic pleuritic chest pain, there is nothing that I can do differently from a pulmonary standpoint. [facility] is being [facility] by rheumatology, and they are trying her on different immunosuppressive medications. I have asked her to followup with me on an as needed basis, and I would especially need to see her if she has progressive dyspnea on exertion that is unexplained.

## 2016-02-29 ENCOUNTER — Other Ambulatory Visit: Payer: Self-pay | Admitting: Obstetrics and Gynecology

## 2016-02-29 DIAGNOSIS — R928 Other abnormal and inconclusive findings on diagnostic imaging of breast: Secondary | ICD-10-CM

## 2016-03-05 ENCOUNTER — Ambulatory Visit
Admission: RE | Admit: 2016-03-05 | Discharge: 2016-03-05 | Disposition: A | Discharge status: Home / Self Care | Payer: 59 | Source: Ambulatory Visit | Attending: Obstetrics and Gynecology | Admitting: Obstetrics and Gynecology

## 2016-03-05 DIAGNOSIS — R928 Other abnormal and inconclusive findings on diagnostic imaging of breast: Secondary | ICD-10-CM

## 2016-03-19 NOTE — H&P (Signed)
Samul DadaLisa T Righetti  DICTATION # [phone] CSN# [id number]   Meriel PicaHOLLAND,Harlene Petralia M, MD 03/19/2016 1:44 PM

## 2016-03-20 NOTE — H&P (Signed)
Jillian Cruz:  Cruz, Jillian                 ACCOUNT NO.:  [id number]  MEDICAL RECORD NO.:  [id number]  LOCATION:                                 FACILITY:  PHYSICIAN:  Jillian Cruz, M.D.DATE OF BIRTH:  09/16/1970  DATE OF ADMISSION: DATE OF DISCHARGE:                             HISTORY & PHYSICAL   CHIEF COMPLAINT:  Abnormal bleeding, endometrial polyp, request permanent sterilization.  HISTORY OF PRESENT ILLNESS:  A 45 year old, G1, P1 patient, currently using condoms who has had menorrhagia with increased cramping.  We had tried an IUD without significant response, so [facility] that showed probable adenomyosis and a small fundal polyp.  She is sure she would not want to be pregnant again under any circumstance, so presents at this time for lap tubal with Filshie clips, D and C, hysteroscopy with MyoSure resection of the polyp and NovaSure endometrial ablation.  This procedure including specific risks related to bleeding, infection, adjacent organ injury, wound infection, phlebitis, the permanence of the tubal procedure and failure rated 2 to 09/998 discussed, which she understands and accepts.  PAST MEDICAL HISTORY:  Significant for lupus.  MEDICATIONS:  She is currently on Plaquenil, ibuprofen p.r.n.  FAMILY HISTORY:  Also significant for arthritis.  PAST SURGICAL HISTORY:  One C-section, cholecystectomy.  SOCIAL HISTORY:  Smokes 1 PPD.  Denies drug or alcohol use.  She is divorced.  Her medical doctor is Dr. Assunta FoundJohn Golding.  Last Pap in August 2017 was negative.  PHYSICAL EXAM:  VITAL SIGNS:  Temperature 98.2, blood pressure 130/82. HEENT:  Unremarkable. NECK:  Supple without masses. LUNGS:  Clear. CARDIOVASCULAR:  Regular rate and rhythm without murmurs, rubs, or gallops. BREASTS:  Without masses. ABDOMEN:  Soft, flat, nontender. PELVIC:  Vulva, vagina, cervix normal.  Uterus mid position, upper limit of normal size, mobile.  Adnexa  negative. EXTREMITIES:  Unremarkable. NEUROLOGIC:  Unremarkable.  IMPRESSION: 1. Menorrhagia with dysmenorrhea. 2. Probable adenomyosis. 3. Endometrial polyp. 4. Request permanent sterilization.  PLAN:  Laparoscopic tubal with Filshie clips, D and C, hysteroscopy with MyoSure and NovaSure endometrial ablation.  Procedure and risks reviewed as above.     Jillian Cruz M. Marcelle Cruz, M.D.   ______________________________ Jillian Cruz, M.D.    [facility]/MEDQ  D:  03/19/2016  T:  03/20/2016  Job:  [id number]

## 2016-03-24 ENCOUNTER — Encounter (HOSPITAL_COMMUNITY): Payer: Self-pay

## 2016-03-24 ENCOUNTER — Emergency Department (HOSPITAL_COMMUNITY)
Admission: EM | Admit: 2016-03-24 | Discharge: 2016-03-24 | Disposition: A | Discharge status: Home / Self Care | Payer: 59 | Attending: Emergency Medicine | Admitting: Emergency Medicine

## 2016-03-24 DIAGNOSIS — R220 Localized swelling, mass and lump, head: Secondary | ICD-10-CM | POA: Insufficient documentation

## 2016-03-24 DIAGNOSIS — T7840XA Allergy, unspecified, initial encounter: Secondary | ICD-10-CM | POA: Diagnosis present

## 2016-03-24 DIAGNOSIS — F17210 Nicotine dependence, cigarettes, uncomplicated: Secondary | ICD-10-CM | POA: Insufficient documentation

## 2016-03-24 MED ORDER — NYSTATIN 100000 UNIT/GM EX OINT: 1.0000 "application " | TOPICAL_OINTMENT | Freq: Three times a day (TID) | CUTANEOUS | 0 refills | Status: DC

## 2016-03-24 NOTE — ED Provider Notes (Signed)
[facility]-EMERGENCY DEPT Provider Note   CSN: [id number] Arrival date & time: 03/24/16  0555     History   Chief Complaint Chief Complaint  Patient presents with  . Allergic Reaction    HPI Jillian Cruz is a 45 y.o. female presenting with upper and lower lip swelling that have been present for about 4 days. She states last week she developed some burning to the insides of both lips. 4 days ago developed swelling and pain. Each day it has progressively worsened. She has never knows any lesions such as cold sores. Went to urgent care when it started swelling and they started her on valacyclovir and a four-day prednisone course. No relief. Tried Benadryl last night. She was told she had some wheezing when she went to urgent care and was given albuterol inhaler but has not had to use it since. No dyspnea or lip swelling. No obvious food or other allergens that could've caused it. No other areas of rash or itching. Feels painful/burning.  HPI  Past Medical History:  Diagnosis Date  . Lupus anticoagulant disorder (HCC)   . Raynaud disease 2011  . Scleroderma (HCC) 2011    Patient Active Problem List   Diagnosis Date Noted  . Tobacco dependence 05/18/2013  . Dyslipidemia 05/18/2013  . Lupus (systemic lupus erythematosus) (HCC) 05/18/2013  . Obesity (BMI 35.0-39.9 without comorbidity) (HCC) 05/18/2013  . H. pylori infection 09/01/2012  . Dyspnea 11/14/2010  . Lupus anticoagulant disorder (HCC) 11/14/2010    Past Surgical History:  Procedure Laterality Date  . CESAREAN SECTION  2003  . ESOPHAGOGASTRODUODENOSCOPY N/A 09/03/2012   Procedure: ESOPHAGOGASTRODUODENOSCOPY (EGD);  Surgeon: Malissa HippoNajeeb U Rehman, MD;  Location: [facility];  Service: Endoscopy;  Laterality: N/A;  325  . GALLBLADDER SURGERY  2011    OB History    No data available       Home Medications    Prior to Admission medications   Medication Sig Start Date End Date Taking? Authorizing Provider  Belimumab  (BENLYSTA IV) Inject into the vein. Every 28 days    Historical Provider, MD  buPROPion (WELLBUTRIN) 100 MG tablet Take 200 mg by mouth daily. 09/29/13   Historical Provider, MD  cholecalciferol (VITAMIN D) 1000 UNITS tablet Take 1,000 Units by mouth daily.     Historical Provider, MD  DULoxetine (CYMBALTA) 60 MG capsule Take 60 mg by mouth daily.    Historical Provider, MD  hydroxychloroquine (PLAQUENIL) 200 MG tablet Take 200 mg by mouth 2 (two) times daily.      Historical Provider, MD  ibuprofen (ADVIL,MOTRIN) 200 MG tablet Take 400 mg by mouth 3 (three) times daily as needed for pain (pain for lupus).    Historical Provider, MD  nystatin ointment (MYCOSTATIN) Apply 1 application topically 3 (three) times daily. For 2 weeks 03/24/16   Pricilla LovelessScott Shirlean Berman, MD    Family History Family History  Problem Relation Age of Onset  . Heart disease Father   . Kidney cancer Father     Social History Social History  Substance Use Topics  . Smoking status: Current Every Day Smoker    Packs/day: 1.00    Years: 25.00    Types: Cigarettes  . Smokeless tobacco: Never Used  . Alcohol use No     Allergies   Review of patient's allergies indicates no known allergies.   Review of Systems Review of Systems  HENT: Positive for facial swelling (lips). Negative for trouble swallowing and voice change.   Respiratory: Negative for  shortness of breath.   Skin: Negative for rash.  All other systems reviewed and are negative.    Physical Exam Updated Vital Signs BP 123/73   Pulse 73   Temp 98.8 F (37.1 C) (Oral)   Resp 16   Ht 5\' 7"  (1.702 m)   Wt 230 lb (104.3 kg)   LMP 03/05/2016   SpO2 98%   BMI 36.02 kg/m   Physical Exam  Constitutional: She is oriented to person, place, and time. She appears well-developed and well-nourished.  HENT:  Head: Normocephalic and atraumatic.  Right Ear: External ear normal.  Left Ear: External ear normal.  Nose: Nose normal.  Both lips are moderately  swollen with a shiny appearance. No obvious vesicles. Symmetric. Occasional cracks in skin of lips, especially lower.  No tongue swelling or oropharyngeal swelling  Eyes: Right eye exhibits no discharge. Left eye exhibits no discharge.  Cardiovascular: Normal rate, regular rhythm and normal heart sounds.   Pulmonary/Chest: Effort normal and breath sounds normal. She has no wheezes.  Abdominal: She exhibits no distension.  Neurological: She is alert and oriented to person, place, and time.  Skin: Skin is warm and dry.  Nursing note and vitals reviewed.    ED Treatments / Results  Labs (all labs ordered are listed, but only abnormal results are displayed) Labs Reviewed - No data to display  EKG  EKG Interpretation None       Radiology No results found.  Procedures Procedures (including critical care time)  Medications Ordered in ED Medications - No data to display   Initial Impression / Assessment and Plan / ED Course  I have reviewed the triage vital signs and the nursing notes.  Pertinent labs & imaging results that were available during my care of the patient were reviewed by me and considered in my medical decision making (see chart for details).  Clinical Course    Will cover patient for possible fungal skin infection with nystatin. She has a dermatologist, I have encouraged her to follow-up closely with them for further testing if no improvement. No signs of impending airway compromise. No tongue or oropharyngeal swelling.  Final Clinical Impressions(s) / ED Diagnoses   Final diagnoses:  Lip swelling    New Prescriptions New Prescriptions   NYSTATIN OINTMENT (MYCOSTATIN)    Apply 1 application topically 3 (three) times daily. For 2 weeks     Pricilla Loveless, MD 03/24/16 [phone]

## 2016-03-24 NOTE — ED Triage Notes (Signed)
Pt started having swelling of her lips that started Tuesday. Pt has no known allergies. [facility] and has not had improvements.

## 2016-04-03 ENCOUNTER — Encounter (HOSPITAL_BASED_OUTPATIENT_CLINIC_OR_DEPARTMENT_OTHER): Payer: Self-pay | Admitting: *Deleted

## 2016-04-03 NOTE — Progress Notes (Signed)
NPO AFTER MN.  ARRIVE AT 0600.  NEEDS HG AND URINE PREG.  

## 2016-04-04 ENCOUNTER — Encounter (HOSPITAL_BASED_OUTPATIENT_CLINIC_OR_DEPARTMENT_OTHER): Payer: Self-pay | Admitting: Anesthesiology

## 2016-04-04 NOTE — Anesthesia Preprocedure Evaluation (Addendum)
Anesthesia Evaluation  Patient identified by MRN, date of birth, ID band Patient awake  General Assessment Comment:Lupus Raynaud's disease  Reviewed: Allergy & Precautions, NPO status , Patient's Chart, lab work & pertinent test results  History of Anesthesia Complications (+) PONV and history of anesthetic complications  Airway Mallampati: II  TM Distance: >3 FB Neck ROM: Full    Dental no notable dental hx. (+) Teeth Intact, Dental Advisory Given   Pulmonary shortness of breath, Current Smoker,    Pulmonary exam normal breath sounds clear to auscultation       Cardiovascular + Peripheral Vascular Disease  Normal cardiovascular exam+ Valvular Problems/Murmurs  Rhythm:Regular Rate:Normal  12-08-10 2D Echo - Left ventricle: The cavity size was normal. Wall thickness was normal. Systolic function was normal. The estimated ejection fraction was in the range of 55% to 60%. Wall motion was normal; there were no regional wall motion abnormalities. - Mitral valve: Mild regurgitation.   Neuro/Psych negative neurological ROS  negative psych ROS   GI/Hepatic negative GI ROS, Neg liver ROS,   Endo/Other  negative endocrine ROS  Renal/GU negative Renal ROS  negative genitourinary   Musculoskeletal negative musculoskeletal ROS (+)   Abdominal (+) + obese,   Peds negative pediatric ROS (+)  Hematology negative hematology ROS (+)   Anesthesia Other Findings   Reproductive/Obstetrics negative OB ROS                           Anesthesia Physical Anesthesia Plan  ASA: III  Anesthesia Plan: General   Post-op Pain Management:    Induction: Intravenous  Airway Management Planned: LMA and Oral ETT  Additional Equipment:   Intra-op Plan:   Post-operative Plan: Extubation in OR  Informed Consent: I have reviewed the patients History and Physical, chart, labs and discussed the procedure  including the risks, benefits and alternatives for the proposed anesthesia with the patient or authorized representative who has indicated his/her understanding and acceptance.   Dental advisory given  Plan Discussed with: CRNA  Anesthesia Plan Comments:        Anesthesia Quick Evaluation

## 2016-04-05 ENCOUNTER — Ambulatory Visit (HOSPITAL_BASED_OUTPATIENT_CLINIC_OR_DEPARTMENT_OTHER)
Admission: RE | Admit: 2016-04-05 | Discharge: 2016-04-05 | Disposition: A | Discharge status: Home / Self Care | Payer: 59 | Source: Ambulatory Visit | Attending: Obstetrics and Gynecology | Admitting: Obstetrics and Gynecology

## 2016-04-05 ENCOUNTER — Ambulatory Visit (HOSPITAL_BASED_OUTPATIENT_CLINIC_OR_DEPARTMENT_OTHER): Payer: 59 | Admitting: Anesthesiology

## 2016-04-05 ENCOUNTER — Encounter (HOSPITAL_BASED_OUTPATIENT_CLINIC_OR_DEPARTMENT_OTHER): Payer: Self-pay | Admitting: *Deleted

## 2016-04-05 ENCOUNTER — Encounter (HOSPITAL_BASED_OUTPATIENT_CLINIC_OR_DEPARTMENT_OTHER)
Admission: RE | Disposition: A | Discharge status: Home / Self Care | Payer: Self-pay | Source: Ambulatory Visit | Attending: Obstetrics and Gynecology

## 2016-04-05 DIAGNOSIS — Z720 Tobacco use: Secondary | ICD-10-CM | POA: Insufficient documentation

## 2016-04-05 DIAGNOSIS — N920 Excessive and frequent menstruation with regular cycle: Secondary | ICD-10-CM | POA: Diagnosis not present

## 2016-04-05 DIAGNOSIS — N946 Dysmenorrhea, unspecified: Secondary | ICD-10-CM | POA: Diagnosis not present

## 2016-04-05 DIAGNOSIS — Z302 Encounter for sterilization: Secondary | ICD-10-CM | POA: Diagnosis present

## 2016-04-05 HISTORY — DX: Personal history of other medical treatment: Z92.89

## 2016-04-05 HISTORY — DX: Pain in unspecified joint: M25.50

## 2016-04-05 HISTORY — DX: Personal history of other diseases of the respiratory system: Z87.09

## 2016-04-05 HISTORY — DX: Polyp of corpus uteri: N84.0

## 2016-04-05 HISTORY — DX: Other specified abnormal immunological findings in serum: R76.89

## 2016-04-05 HISTORY — DX: Excessive and frequent menstruation with regular cycle: N92.0

## 2016-04-05 HISTORY — DX: Other specified postprocedural states: Z98.890

## 2016-04-05 HISTORY — DX: Dysmenorrhea, unspecified: N94.6

## 2016-04-05 HISTORY — DX: Other chronic pain: G89.29

## 2016-04-05 HISTORY — DX: Systemic lupus erythematosus, unspecified: M32.9

## 2016-04-05 HISTORY — DX: Personal history of pneumonia (recurrent): Z87.01

## 2016-04-05 HISTORY — DX: Family history of ischemic heart disease and other diseases of the circulatory system: Z82.49

## 2016-04-05 HISTORY — DX: Other specified postprocedural states: R11.2

## 2016-04-05 HISTORY — DX: Other lesions of oral mucosa: K13.79

## 2016-04-05 HISTORY — DX: Nausea with vomiting, unspecified: R11.2

## 2016-04-05 HISTORY — DX: Other specified abnormal immunological findings in serum: R76.8

## 2016-04-05 HISTORY — DX: Presence of spectacles and contact lenses: Z97.3

## 2016-04-05 HISTORY — DX: Cardiac murmur, unspecified: R01.1

## 2016-04-05 HISTORY — DX: Personal history of other diseases of the digestive system: Z87.19

## 2016-04-05 HISTORY — DX: Hyperlipidemia, unspecified: E78.5

## 2016-04-05 HISTORY — DX: Personal history of other infectious and parasitic diseases: Z86.19

## 2016-04-05 HISTORY — PX: DILITATION & CURRETTAGE/HYSTROSCOPY WITH NOVASURE ABLATION: SHX5568

## 2016-04-05 HISTORY — PX: LAPAROSCOPIC TUBAL LIGATION: SHX1937

## 2016-04-05 LAB — POCT PREGNANCY, URINE: PREG TEST UR: NEGATIVE

## 2016-04-05 LAB — POCT HEMOGLOBIN-HEMACUE: HEMOGLOBIN: 14.9 g/dL (ref 12.0–15.0)

## 2016-04-05 SURGERY — LIGATION, FALLOPIAN TUBE, LAPAROSCOPIC
Anesthesia: General | Site: Uterus

## 2016-04-05 MED ORDER — FENTANYL CITRATE (PF) 100 MCG/2ML IJ SOLN
INTRAMUSCULAR | Status: AC
  Filled 2016-04-05: qty 2

## 2016-04-05 MED ORDER — ONDANSETRON HCL 4 MG/2ML IJ SOLN
INTRAMUSCULAR | Status: DC | PRN
  Administered 2016-04-05: 4 mg via INTRAVENOUS

## 2016-04-05 MED ORDER — MIDAZOLAM HCL 2 MG/ML PO SYRP
0.5000 mg/kg | ORAL_SOLUTION | Freq: Once | ORAL | Status: DC
  Filled 2016-04-05: qty 26.3

## 2016-04-05 MED ORDER — FENTANYL CITRATE (PF) 100 MCG/2ML IJ SOLN
INTRAMUSCULAR | Status: DC | PRN
  Administered 2016-04-05 (×3): 50 ug via INTRAVENOUS
  Administered 2016-04-05: 100 ug via INTRAVENOUS
  Administered 2016-04-05: 50 ug via INTRAVENOUS

## 2016-04-05 MED ORDER — ARTIFICIAL TEARS OP OINT
TOPICAL_OINTMENT | OPHTHALMIC | Status: AC
Start: 2016-04-05 — End: 2016-04-05
  Filled 2016-04-05: qty 3.5

## 2016-04-05 MED ORDER — FENTANYL CITRATE (PF) 100 MCG/2ML IJ SOLN
25.0000 ug | INTRAMUSCULAR | Status: DC | PRN
  Administered 2016-04-05 (×2): 50 ug via INTRAVENOUS
  Filled 2016-04-05: qty 1

## 2016-04-05 MED ORDER — LIDOCAINE 2% (20 MG/ML) 5 ML SYRINGE
INTRAMUSCULAR | Status: DC | PRN
  Administered 2016-04-05: 80 mg via INTRAVENOUS

## 2016-04-05 MED ORDER — DEXAMETHASONE SODIUM PHOSPHATE 4 MG/ML IJ SOLN
INTRAMUSCULAR | Status: DC | PRN
  Administered 2016-04-05: 10 mg via INTRAVENOUS

## 2016-04-05 MED ORDER — OXYCODONE-ACETAMINOPHEN 5-325 MG PO TABS
1.0000 | ORAL_TABLET | Freq: Once | ORAL | Status: AC
  Administered 2016-04-05: 1 via ORAL
  Filled 2016-04-05: qty 1

## 2016-04-05 MED ORDER — OXYCODONE-ACETAMINOPHEN 5-325 MG PO TABS: 1.0000 | ORAL_TABLET | ORAL | 0 refills | Status: DC | PRN

## 2016-04-05 MED ORDER — SUGAMMADEX SODIUM 200 MG/2ML IV SOLN
INTRAVENOUS | Status: DC | PRN
  Administered 2016-04-05: 200 mg via INTRAVENOUS

## 2016-04-05 MED ORDER — OXYCODONE-ACETAMINOPHEN 5-325 MG PO TABS
ORAL_TABLET | ORAL | Status: AC
  Filled 2016-04-05: qty 1

## 2016-04-05 MED ORDER — CEFOTETAN DISODIUM-DEXTROSE 2-2.08 GM-% IV SOLR
INTRAVENOUS | Status: AC
Start: 2016-04-05 — End: 2016-04-05
  Filled 2016-04-05: qty 50

## 2016-04-05 MED ORDER — LIDOCAINE 2% (20 MG/ML) 5 ML SYRINGE
INTRAMUSCULAR | Status: AC
  Filled 2016-04-05: qty 15

## 2016-04-05 MED ORDER — SCOPOLAMINE 1 MG/3DAYS TD PT72
MEDICATED_PATCH | TRANSDERMAL | Status: DC | PRN
  Administered 2016-04-05: 1 via TRANSDERMAL

## 2016-04-05 MED ORDER — KETOROLAC TROMETHAMINE 30 MG/ML IJ SOLN
INTRAMUSCULAR | Status: DC | PRN
  Administered 2016-04-05: 30 mg via INTRAVENOUS

## 2016-04-05 MED ORDER — DEXTROSE 5 % IV SOLN
2.0000 g | INTRAVENOUS | Status: AC
  Administered 2016-04-05: 2 g via INTRAVENOUS
  Filled 2016-04-05: qty 2

## 2016-04-05 MED ORDER — PROPOFOL 10 MG/ML IV BOLUS
INTRAVENOUS | Status: DC | PRN
  Administered 2016-04-05: 200 mg via INTRAVENOUS

## 2016-04-05 MED ORDER — LIDOCAINE HCL 1 % IJ SOLN
INTRAMUSCULAR | Status: DC | PRN
  Administered 2016-04-05: 10 mL

## 2016-04-05 MED ORDER — MIDAZOLAM HCL 2 MG/2ML IJ SOLN
INTRAMUSCULAR | Status: AC
  Filled 2016-04-05: qty 2

## 2016-04-05 MED ORDER — FENTANYL CITRATE (PF) 100 MCG/2ML IJ SOLN
INTRAMUSCULAR | Status: AC
  Filled 2016-04-05: qty 4

## 2016-04-05 MED ORDER — LACTATED RINGERS IR SOLN
Status: DC | PRN
  Administered 2016-04-05: 3000 mL

## 2016-04-05 MED ORDER — IBUPROFEN 200 MG PO TABS: 800.0000 mg | ORAL_TABLET | Freq: Three times a day (TID) | ORAL | 1 refills | Status: DC | PRN

## 2016-04-05 MED ORDER — PROMETHAZINE HCL 25 MG/ML IJ SOLN
6.2500 mg | INTRAMUSCULAR | Status: DC | PRN
  Filled 2016-04-05: qty 1

## 2016-04-05 MED ORDER — MIDAZOLAM HCL 5 MG/5ML IJ SOLN
INTRAMUSCULAR | Status: DC | PRN
  Administered 2016-04-05: 2 mg via INTRAVENOUS

## 2016-04-05 MED ORDER — SCOPOLAMINE 1 MG/3DAYS TD PT72
MEDICATED_PATCH | TRANSDERMAL | Status: AC
  Filled 2016-04-05: qty 1

## 2016-04-05 MED ORDER — LACTATED RINGERS IV SOLN
INTRAVENOUS | Status: DC
  Administered 2016-04-05 (×2): via INTRAVENOUS
  Filled 2016-04-05: qty 1000

## 2016-04-05 MED ORDER — ROCURONIUM BROMIDE 10 MG/ML (PF) SYRINGE
PREFILLED_SYRINGE | INTRAVENOUS | Status: DC | PRN
  Administered 2016-04-05: 35 mg via INTRAVENOUS

## 2016-04-05 MED ORDER — BUPIVACAINE HCL (PF) 0.25 % IJ SOLN
INTRAMUSCULAR | Status: DC | PRN
  Administered 2016-04-05: 20 mL

## 2016-04-05 SURGICAL SUPPLY — 41 items
ABLATOR ENDOMETRIAL BIPOLAR (ABLATOR) ×2 IMPLANT
BLADE SURG 11 STRL SS (BLADE) ×4 IMPLANT
CANISTER SUCTION 2500CC (MISCELLANEOUS) ×4 IMPLANT
CATH ROBINSON RED A/P 16FR (CATHETERS) ×2 IMPLANT
CLIP FILSHIE TUBAL LIGA STRL (Clip) ×4 IMPLANT
COVER BACK TABLE 60X90IN (DRAPES) ×4 IMPLANT
DRAPE UNDERBUTTOCKS STRL (DRAPE) ×4 IMPLANT
DRSG COVADERM PLUS 2X2 (GAUZE/BANDAGES/DRESSINGS) ×2 IMPLANT
DRSG TELFA 3X8 NADH (GAUZE/BANDAGES/DRESSINGS) ×4 IMPLANT
GLOVE BIO SURGEON STRL SZ7 (GLOVE) ×10 IMPLANT
GOWN STRL REUS W/ TWL LRG LVL3 (GOWN DISPOSABLE) ×4 IMPLANT
GOWN STRL REUS W/ TWL XL LVL3 (GOWN DISPOSABLE) ×2 IMPLANT
GOWN STRL REUS W/TWL LRG LVL3 (GOWN DISPOSABLE) ×8
GOWN STRL REUS W/TWL XL LVL3 (GOWN DISPOSABLE) ×4
KIT ROOM TURNOVER WOR (KITS) ×4 IMPLANT
LEGGING LITHOTOMY PAIR STRL (DRAPES) ×4 IMPLANT
LIQUID BAND (GAUZE/BANDAGES/DRESSINGS) ×2 IMPLANT
NDL HYPO 25X1 1.5 SAFETY (NEEDLE) ×2 IMPLANT
NDL INSUFFLATION 14GA 120MM (NEEDLE) ×2 IMPLANT
NDL SPNL 22GX3.5 QUINCKE BK (NEEDLE) ×2 IMPLANT
NEEDLE HYPO 25X1 1.5 SAFETY (NEEDLE) ×4 IMPLANT
NEEDLE INSUFFLATION 14GA 120MM (NEEDLE) ×4 IMPLANT
NEEDLE SPNL 22GX3.5 QUINCKE BK (NEEDLE) ×4 IMPLANT
NS IRRIG 500ML POUR BTL (IV SOLUTION) ×4 IMPLANT
PACK BASIN DAY SURGERY FS (CUSTOM PROCEDURE TRAY) ×4 IMPLANT
PACK LAPAROSCOPY II (CUSTOM PROCEDURE TRAY) ×4 IMPLANT
PAD DRESSING TELFA 3X8 NADH (GAUZE/BANDAGES/DRESSINGS) ×2 IMPLANT
PAD OB MATERNITY 4.3X12.25 (PERSONAL CARE ITEMS) ×4 IMPLANT
PAD PREP 24X48 CUFFED NSTRL (MISCELLANEOUS) ×4 IMPLANT
PADDING ION DISPOSABLE (MISCELLANEOUS) ×4 IMPLANT
SOLUTION ANTI FOG 6CC (MISCELLANEOUS) ×4 IMPLANT
SUT MNCRL AB 4-0 PS2 18 (SUTURE) ×4 IMPLANT
SYR CONTROL 10ML LL (SYRINGE) ×4 IMPLANT
SYRINGE 10CC LL (SYRINGE) ×4 IMPLANT
TOWEL OR 17X24 6PK STRL BLUE (TOWEL DISPOSABLE) ×8 IMPLANT
TRAY DSU PREP LF (CUSTOM PROCEDURE TRAY) ×4 IMPLANT
TROCAR OPTI TIP 5M 100M (ENDOMECHANICALS) ×2 IMPLANT
TROCAR XCEL DIL TIP R 11M (ENDOMECHANICALS) ×4 IMPLANT
TUBING AQUILEX INFLOW (TUBING) ×2 IMPLANT
TUBING AQUILEX OUTFLOW (TUBING) ×2 IMPLANT
TUBING INSUFFLATION W/FILTER (TUBING) ×2 IMPLANT

## 2016-04-05 NOTE — Transfer of Care (Signed)
Immediate Anesthesia Transfer of Care Note  Patient: Jillian Cruz  Procedure(s) Performed: Procedure(s): LAPAROSCOPIC TUBAL LIGATION with filshie clips (Bilateral) DILATATION & CURETTAGE/HYSTEROSCOPY WITH NOVASURE ABLATION (N/A)  Patient Location: PACU  Anesthesia Type:General  Level of Consciousness: awake, alert , oriented and patient cooperative  Airway & Oxygen Therapy: Patient Spontanous Breathing and Patient connected to nasal cannula oxygen  Post-op Assessment: Report given to RN and Post -op Vital signs reviewed and stable  Post vital signs: Reviewed and stable  Last Vitals:  Vitals:   04/05/16 0614  BP: 120/72  Pulse: 84  Resp: 16  Temp: 37 C    Last Pain:  Vitals:   04/05/16 0614  TempSrc: Oral      Patients Stated Pain Goal: 7 (04/05/16 16100638)  Complications: No apparent anesthesia complications

## 2016-04-05 NOTE — Anesthesia Postprocedure Evaluation (Signed)
Anesthesia Post Note  Patient: Samul DadaLisa T Campau  Procedure(s) Performed: Procedure(s) (LRB): LAPAROSCOPIC TUBAL LIGATION with filshie clips (Bilateral) DILATATION & CURETTAGE/HYSTEROSCOPY WITH NOVASURE ABLATION (N/A)  Patient location during evaluation: PACU Anesthesia Type: General Level of consciousness: awake and alert Pain management: pain level controlled Vital Signs Assessment: post-procedure vital signs reviewed and stable Respiratory status: spontaneous breathing, nonlabored ventilation, respiratory function stable and patient connected to nasal cannula oxygen Cardiovascular status: blood pressure returned to baseline and stable Postop Assessment: no signs of nausea or vomiting Anesthetic complications: no    Last Vitals:  Vitals:   04/05/16 0930 04/05/16 1003  BP: 134/69 136/68  Pulse: 80 68  Resp: (!) 21 16  Temp:  36.8 C    Last Pain:  Vitals:   04/05/16 1003  TempSrc:   PainSc: 5                  Aerie Donica J

## 2016-04-05 NOTE — Op Note (Signed)
Preoperative diagnosis: Request permanent sterilization, menorrhagia, possible endometrial polyp  Postoperative diagnosis: Same  Procedure: Laparoscopic tubal by Filshie clip application, D&C hysteroscopy with paracervical block, NovaSure endometrial ablation  Surgeon: Marcelle OverlieHolland  Anesthesia: Gen.  EBL: 20 cc  Specimens removed: Endometrial curettings, to pathology  Procedure and findings:  The patient taken the operating room after an adequate level of general anesthesia was obtained with the patient's legs in stirrups the abdomen perineum and vagina were prepped and draped and the bladder was drained, EUA was carried out the uterus was mobile mid position, normal size, adnexa negative. Appropriate timeouts were taken at that point.  Hulka tenaculum was positioned, attention directed to the abdomen where the subumbilical area was infiltrated with quarter percent Marcaine plain small incision was made in the varies needle was introduced without difficulty. 3 L pneumoperitoneum syncopated, lap scopic trocar and sleeve were then introduced that difficulty. There was no evidence of any bleeding or trauma. 3 finger breaths above the symphysis in the midline, a 5 mm trocar was inserted for manipulation. The patient was then placed in Trendelenburg and the pelvic findings as follows:  The uterus itself was normal size, there was a small functional appearing cyst on the right ovary which was otherwise mobile bilateral tubes and ovaries cul-de-sac were free and clear. Quarter percent Marcaine 4-5 cc were then draped across each tube from the cornu to the fimbriated end. The Filshie clip applicator was backloaded, Filshie clip applied a right angle across the right tube completely engulfing it at a right angle this was approximately 3 cm from the cornu after identifying the tube all the way to the fimbriated in and backtracking. The exact same repeated on the opposite side again after tracing the tube out to  the end. These clip applications were photo documented. Inserts removed, gas allowed to escape, the lower incision was closed Dermabond the umbilical incision with a 4-0 Monocryl subcuticular. The patient's legs were extended for the D&C portion. Weighted speculum was positioned cervix grasped with tenaculum paracervical block was then created by infiltrating at 3 and 9:00 submucosally, 5-6 7 cc 1% plain Xylocaine at each site after negative aspiration. Uterus sounded to 9 cm was dilated to a 27 Pratt dilator, sharp curettage was then carried out and she is currently menstruating minimal tissue was sent to pathology the cavity explore with a polyp forcep no easily identified polyps noted. The continuous flow hysteroscope was inserted the cavity was irrigated and noted to be clean. The NovaSure device was position with a 3 cm cervical length, 6 cm fundal length, 3.51 with passing the CO2 test and ablated per protocol. Inserts removed, she tolerated this well went to recovery room in good condition. She did receive Toradol IV during the case  Dictated with [facility]  Trayvond Viets M Eiko Mcgowen M.D.

## 2016-04-05 NOTE — Anesthesia Procedure Notes (Signed)
Procedure Name: Intubation Date/Time: 04/05/2016 7:37 AM Performed by: Tyrone NineSAUVE, Jolina Symonds F Pre-anesthesia Checklist: Patient identified, Timeout performed, Emergency Drugs available, Suction available and Patient being monitored Patient Re-evaluated:Patient Re-evaluated prior to inductionOxygen Delivery Method: Circle system utilized Preoxygenation: Pre-oxygenation with 100% oxygen Intubation Type: IV induction Ventilation: Mask ventilation without difficulty Laryngoscope Size: Mac and 3 Grade View: Grade II Number of attempts: 1 Airway Equipment and Method: Stylet Placement Confirmation: ETT inserted through vocal cords under direct vision,  CO2 detector and breath sounds checked- equal and bilateral Secured at: 22 cm Tube secured with: Tape Dental Injury: Teeth and Oropharynx as per pre-operative assessment

## 2016-04-05 NOTE — Progress Notes (Signed)
The patient was re-examined with no change in status 

## 2016-04-05 NOTE — Discharge Instructions (Signed)
° °D & C Home care Instructions: ° ° °Personal hygiene:  Used sanitary napkins for vaginal drainage not tampons. Shower or tub bathe the day after your procedure. No douching until bleeding stops. Always wipe from front to back after  Elimination. ° °Activity: Do not drive or operate any equipment today. The effects of the anesthesia are still present and drowsiness may result. Rest today, not necessarily flat bed rest, just take it easy. You may resume your normal activity in one to 2 days. ° °Sexual activity: No intercourse for one week or as indicated by your physician ° °Diet: Eat a light diet as desired this evening. You may resume a regular diet tomorrow. ° °Return to work: One to 2 days. ° °General Expectations of your surgery: Vaginal bleeding should be no heavier than a normal period. Spotting may continue up to 10 days. Mild cramps may continue for a couple of days. You may have a regular period in 2-6 weeks. ° °Unexpected observations call your doctor if these occur: persistent or heavy bleeding. Severe abdominal cramping or pain. Elevation of temperature greater than 100°F. ° °Call for an appointment in one week. ° ° ° °Patient's Signature_______________________________________________________ ° °Nurse's Signature________________________________________________________ ° °HOME CARE INSTRUCTIONS - LAPAROSCOPY ° °Wound Care: The bandaids or dressing which are placed over the skin openings may be removed the day after surgery. The incision should be kept clean and dry. The stitches do not need to be removed. Should the incision become sore, red, and swollen after the first week, check with your doctor. ° °Personal Hygiene: Shower the day after your procedure. Always wipe from front to back after elimination.  ° °Activity: Do not drive or operate any equipment today. The effects of the anesthesia are still present and drowsiness may result. Rest today, not necessarily flat bed rest, just take it easy. You  may resume your normal activity in one to three days or as instructed by your physician. ° °Sexual Activity: You resume sexual activity as indicated by your physician_________. °If your laparoscopy was for a sterilization ( tubes tied ), continue current method of birth control until after your next period or ask for specific instructions from your doctor. ° °Diet: Eat a light diet as desired this evening. You may resume a regular diet tomorrow. ° °Return to Work: Two to three days or as indicated by your doctor. ° °Expectations °After Surgery: Your surgery will cause vaginal drainage or spotting which may continue for 2-3 days. Mild abdominal discomfort or tenderness is not unusual and some shoulder pain may also be noted which can be relieved by lying flat in pain. ° °Call Your Doctor °If these Occur:  Persistent or heavy bleeding at incision site °      Redness or swelling around incision °      Elevation of temperature greater than 100 degrees F ° °Call for follow-up appointment _____________. °Post Anesthesia Home Care Instructions ° °Activity: °Get plenty of rest for the remainder of the day. A responsible adult should stay with you for 24 hours following the procedure.  °For the next 24 hours, DO NOT: °-Drive a car °-Operate machinery °-Drink alcoholic beverages °-Take any medication unless instructed by your physician °-Make any legal decisions or sign important papers. ° °Meals: °Start with liquid foods such as gelatin or soup. Progress to regular foods as tolerated. Avoid greasy, spicy, heavy foods. If nausea and/or vomiting occur, drink only clear liquids until the nausea and/or vomiting subsides. Call your physician if   vomiting continues. ° °Special Instructions/Symptoms: °Your throat may feel dry or sore from the anesthesia or the breathing tube placed in your throat during surgery. If this causes discomfort, gargle with warm salt water. The discomfort should disappear within 24 hours. ° °If you had a  scopolamine patch placed behind your ear for the management of post- operative nausea and/or vomiting: ° °1. The medication in the patch is effective for 72 hours, after which it should be removed.  Wrap patch in a tissue and discard in the trash. Wash hands thoroughly with soap and water. °2. You may remove the patch earlier than 72 hours if you experience unpleasant side effects which may include dry mouth, dizziness or visual disturbances. °3. Avoid touching the patch. Wash your hands with soap and water after contact with the patch. °  ° °

## 2016-04-06 ENCOUNTER — Encounter (HOSPITAL_BASED_OUTPATIENT_CLINIC_OR_DEPARTMENT_OTHER): Payer: Self-pay | Admitting: Obstetrics and Gynecology

## 2017-07-22 DIAGNOSIS — M75111 Incomplete rotator cuff tear or rupture of right shoulder, not specified as traumatic: Secondary | ICD-10-CM | POA: Diagnosis not present

## 2017-07-22 DIAGNOSIS — S43431A Superior glenoid labrum lesion of right shoulder, initial encounter: Secondary | ICD-10-CM | POA: Diagnosis not present

## 2017-08-27 DIAGNOSIS — S46191A Other injury of muscle, fascia and tendon of long head of biceps, right arm, initial encounter: Secondary | ICD-10-CM | POA: Diagnosis not present

## 2017-08-27 DIAGNOSIS — M19011 Primary osteoarthritis, right shoulder: Secondary | ICD-10-CM | POA: Diagnosis not present

## 2017-08-27 DIAGNOSIS — M75111 Incomplete rotator cuff tear or rupture of right shoulder, not specified as traumatic: Secondary | ICD-10-CM | POA: Diagnosis not present

## 2017-08-27 DIAGNOSIS — G8918 Other acute postprocedural pain: Secondary | ICD-10-CM | POA: Diagnosis not present

## 2017-08-27 DIAGNOSIS — M24111 Other articular cartilage disorders, right shoulder: Secondary | ICD-10-CM | POA: Diagnosis not present

## 2017-08-27 DIAGNOSIS — M7541 Impingement syndrome of right shoulder: Secondary | ICD-10-CM | POA: Diagnosis not present

## 2017-09-03 DIAGNOSIS — M25511 Pain in right shoulder: Secondary | ICD-10-CM | POA: Diagnosis not present

## 2017-09-09 DIAGNOSIS — M25511 Pain in right shoulder: Secondary | ICD-10-CM | POA: Diagnosis not present

## 2017-09-17 DIAGNOSIS — M25511 Pain in right shoulder: Secondary | ICD-10-CM | POA: Diagnosis not present

## 2017-09-24 DIAGNOSIS — M25511 Pain in right shoulder: Secondary | ICD-10-CM | POA: Diagnosis not present

## 2017-09-27 DIAGNOSIS — M25511 Pain in right shoulder: Secondary | ICD-10-CM | POA: Diagnosis not present

## 2017-10-01 DIAGNOSIS — M25511 Pain in right shoulder: Secondary | ICD-10-CM | POA: Diagnosis not present

## 2017-10-03 DIAGNOSIS — M25511 Pain in right shoulder: Secondary | ICD-10-CM | POA: Diagnosis not present

## 2017-10-07 DIAGNOSIS — M25511 Pain in right shoulder: Secondary | ICD-10-CM | POA: Diagnosis not present

## 2017-10-14 DIAGNOSIS — M25511 Pain in right shoulder: Secondary | ICD-10-CM | POA: Diagnosis not present

## 2017-10-17 DIAGNOSIS — M25511 Pain in right shoulder: Secondary | ICD-10-CM | POA: Diagnosis not present

## 2017-10-21 DIAGNOSIS — M25511 Pain in right shoulder: Secondary | ICD-10-CM | POA: Diagnosis not present

## 2017-10-28 DIAGNOSIS — M25511 Pain in right shoulder: Secondary | ICD-10-CM | POA: Diagnosis not present

## 2017-10-31 DIAGNOSIS — M25511 Pain in right shoulder: Secondary | ICD-10-CM | POA: Diagnosis not present

## 2017-11-04 DIAGNOSIS — M25511 Pain in right shoulder: Secondary | ICD-10-CM | POA: Diagnosis not present

## 2017-11-07 DIAGNOSIS — M25511 Pain in right shoulder: Secondary | ICD-10-CM | POA: Diagnosis not present

## 2017-11-11 DIAGNOSIS — M25511 Pain in right shoulder: Secondary | ICD-10-CM | POA: Diagnosis not present

## 2017-11-15 DIAGNOSIS — M25511 Pain in right shoulder: Secondary | ICD-10-CM | POA: Diagnosis not present

## 2017-11-22 DIAGNOSIS — M25511 Pain in right shoulder: Secondary | ICD-10-CM | POA: Diagnosis not present

## 2017-12-03 DIAGNOSIS — M25511 Pain in right shoulder: Secondary | ICD-10-CM | POA: Diagnosis not present

## 2017-12-06 DIAGNOSIS — M25511 Pain in right shoulder: Secondary | ICD-10-CM | POA: Diagnosis not present

## 2018-04-22 IMAGING — MG MM DIGITAL DIAGNOSTIC UNILAT*R* W/ TOMO W/ CAD
7 series · 8 of 19 positions shown · non-contrast
Comparison: 02/23/2016 and earlier priors

CLINICAL DATA: Possible asymmetry right breast identified on recent
3D screening mammogram.

EXAM:
DIGITAL DIAGNOSTIC RIGHT MAMMOGRAM WITH CAD
ULTRASOUND RIGHT BREAST

[R CC (1 of 2)]
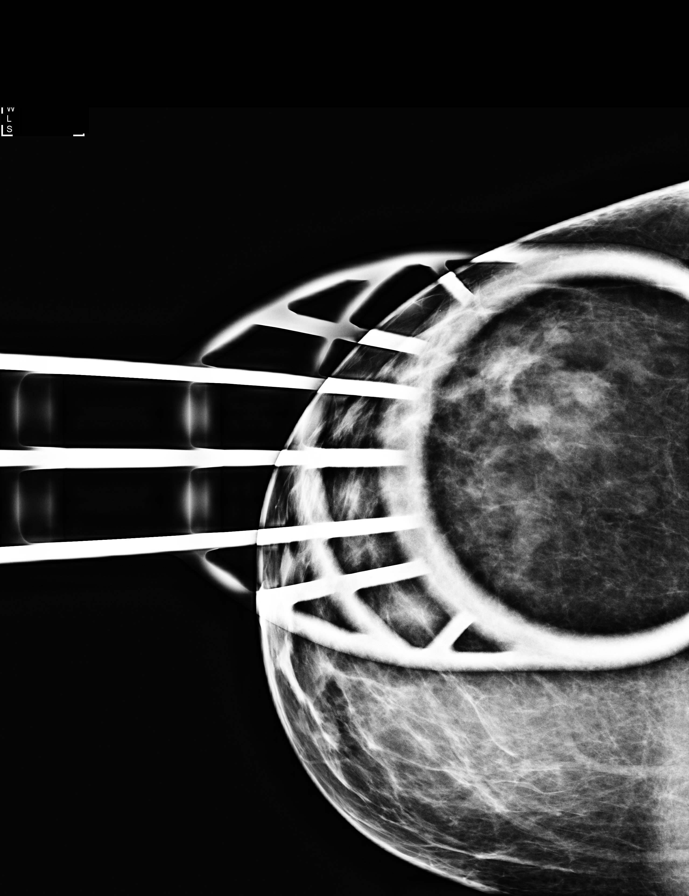

[R ML]
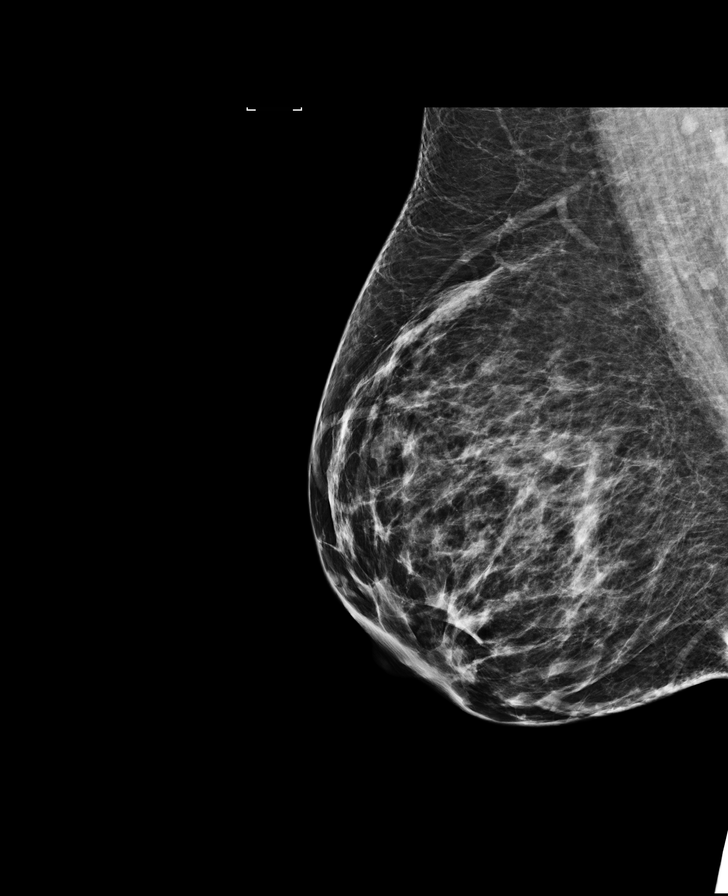

[R CC (2 of 2)]
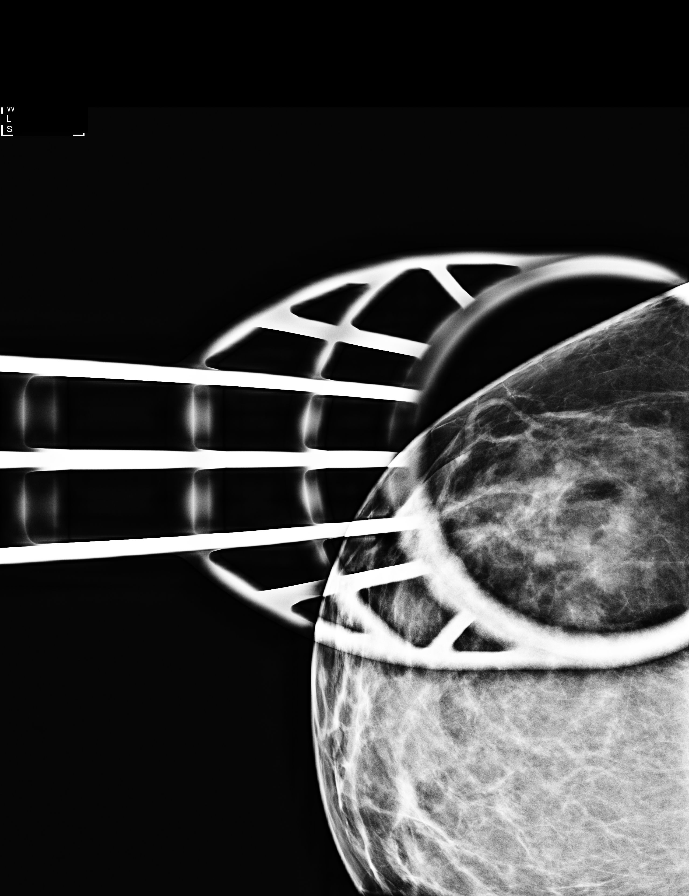

[R ML synth-2D]
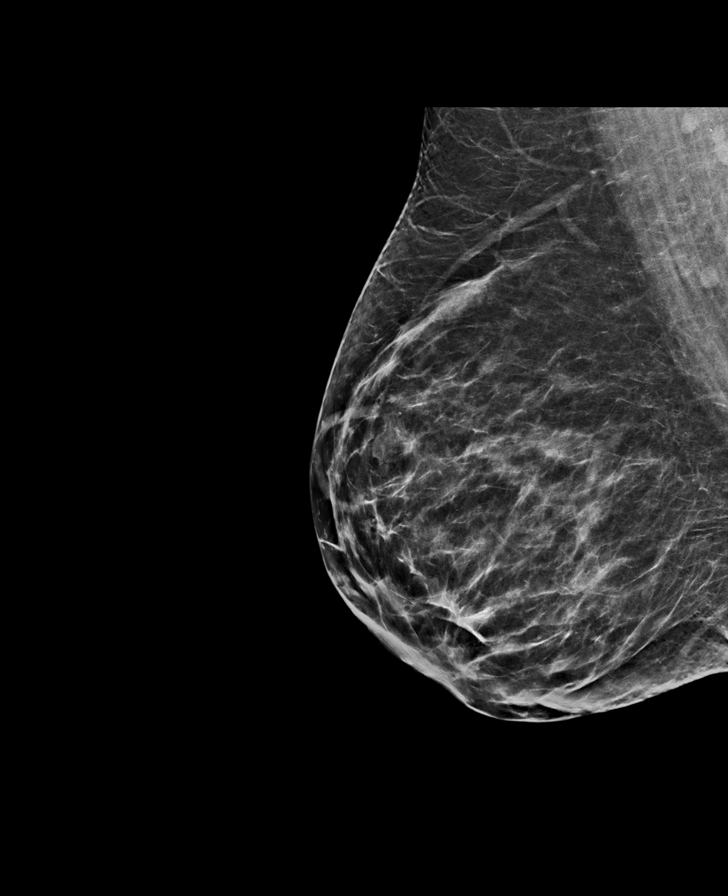

[R CC tomo · 2 of 61 frames shown (1 of 2)]
[frame 20/61]
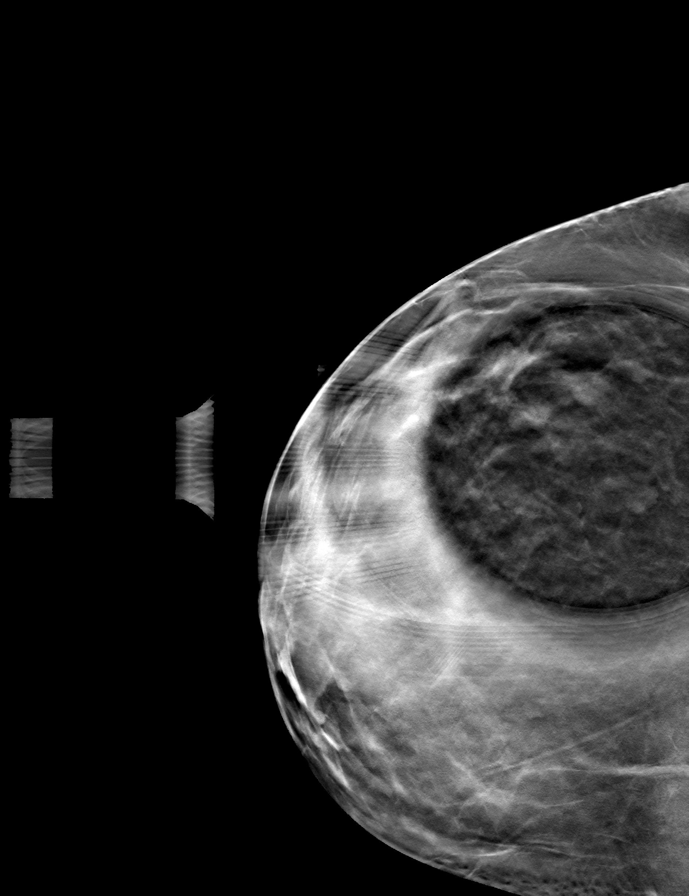
[frame 31/61]
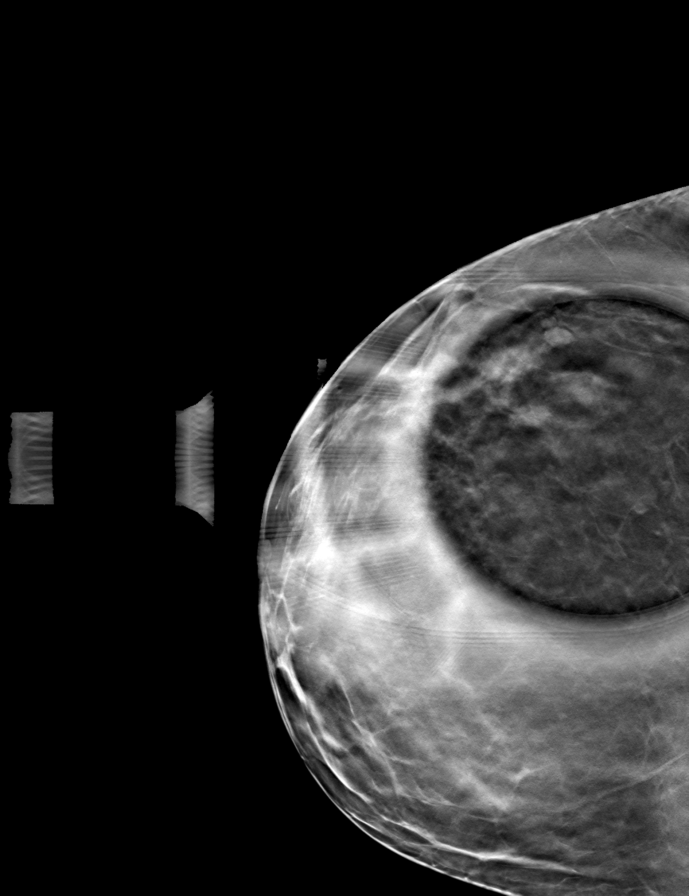

[R ML tomo · tomo slice 35/69.0]
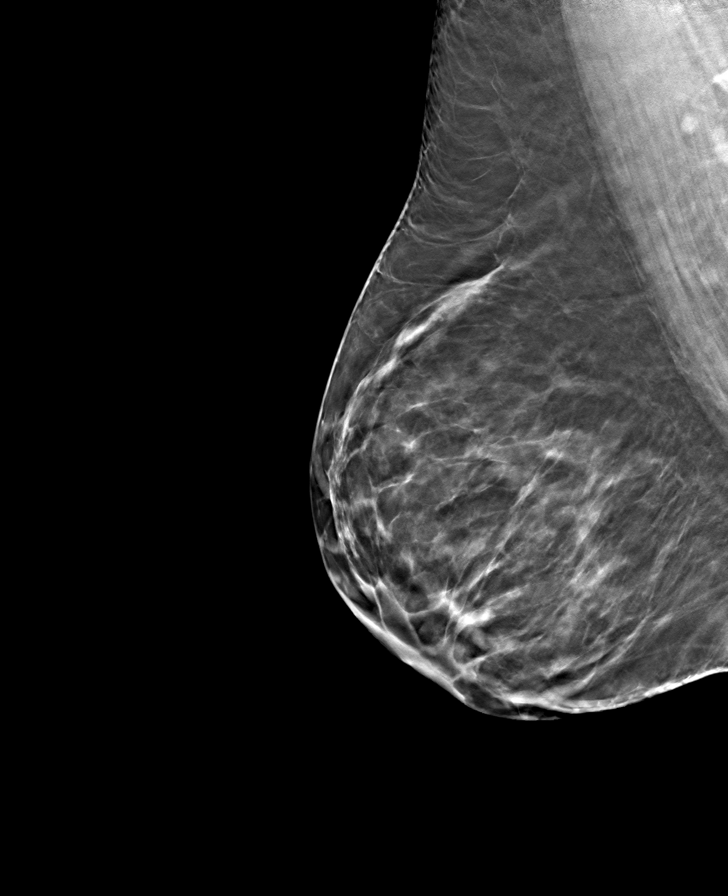

[R CC tomo (2 of 2) · tomo slice 27/54.0]
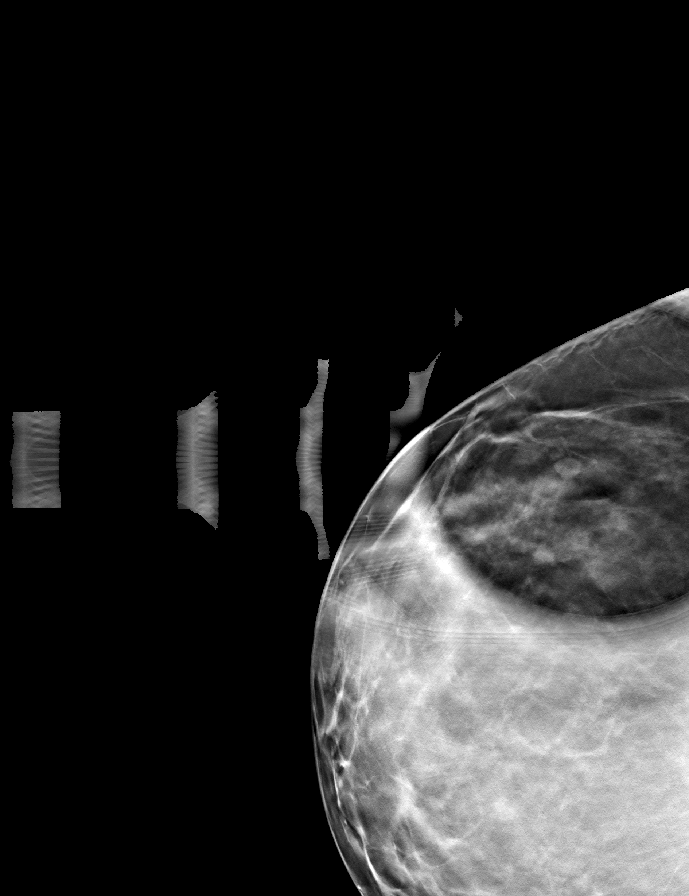

[8 of 19 positions shown; findings below may reference images not displayed]

ACR Breast Density Category b: There are scattered areas of
fibroglandular density.
FINDINGS: Focal spot compression views of the outer right breast with
tomography, and a 90 degrees lateral tomographic view of the right
breast are performed. There is an island of fibroglandular tissue in
the outer right breast without a discrete mass or distortion. There
is a stable benign intramammary lymph node in this region of the
outer right breast.

Mammographic images were processed with CAD.

On physical exam, no mass is palpated in the outer right breast

Targeted ultrasound is performed, showing normal fibroglandular
tissue. No solid or cystic mass or abnormal shadowing is identified.
IMPRESSION: No evidence of malignancy in the right breast.

RECOMMENDATION:
Screening mammogram in one year.(Code:CM-[id number])

I have discussed the findings and recommendations with the patient.
Results were also provided in writing at the conclusion of the
visit. If applicable, a reminder letter will be sent to the patient
regarding the next appointment.

BI-RADS CATEGORY  1: Negative.

## 2018-06-27 DIAGNOSIS — J069 Acute upper respiratory infection, unspecified: Secondary | ICD-10-CM | POA: Diagnosis not present

## 2018-08-27 DIAGNOSIS — N10 Acute tubulo-interstitial nephritis: Secondary | ICD-10-CM | POA: Diagnosis not present

## 2018-08-27 DIAGNOSIS — N12 Tubulo-interstitial nephritis, not specified as acute or chronic: Secondary | ICD-10-CM | POA: Diagnosis not present

## 2018-08-27 DIAGNOSIS — R300 Dysuria: Secondary | ICD-10-CM | POA: Diagnosis not present

## 2018-09-03 ENCOUNTER — Other Ambulatory Visit (HOSPITAL_COMMUNITY): Payer: Self-pay | Admitting: Family Medicine

## 2018-09-03 ENCOUNTER — Other Ambulatory Visit: Payer: Self-pay | Admitting: Family Medicine

## 2018-09-03 ENCOUNTER — Ambulatory Visit (HOSPITAL_COMMUNITY)
Admission: RE | Admit: 2018-09-03 | Discharge: 2018-09-03 | Disposition: A | Discharge status: Home / Self Care | Payer: 59 | Source: Ambulatory Visit | Attending: Family Medicine | Admitting: Family Medicine

## 2018-09-03 DIAGNOSIS — R1031 Right lower quadrant pain: Secondary | ICD-10-CM

## 2018-09-03 DIAGNOSIS — E6609 Other obesity due to excess calories: Secondary | ICD-10-CM | POA: Diagnosis not present

## 2018-09-03 DIAGNOSIS — R197 Diarrhea, unspecified: Secondary | ICD-10-CM | POA: Diagnosis not present

## 2018-09-03 DIAGNOSIS — R3129 Other microscopic hematuria: Secondary | ICD-10-CM | POA: Diagnosis not present

## 2018-09-03 DIAGNOSIS — R319 Hematuria, unspecified: Secondary | ICD-10-CM | POA: Diagnosis not present

## 2018-09-04 DIAGNOSIS — R1031 Right lower quadrant pain: Secondary | ICD-10-CM | POA: Diagnosis not present

## 2018-09-04 DIAGNOSIS — M329 Systemic lupus erythematosus, unspecified: Secondary | ICD-10-CM | POA: Diagnosis not present

## 2018-09-04 DIAGNOSIS — R3915 Urgency of urination: Secondary | ICD-10-CM | POA: Diagnosis not present

## 2018-09-04 DIAGNOSIS — N3011 Interstitial cystitis (chronic) with hematuria: Secondary | ICD-10-CM | POA: Diagnosis not present

## 2018-09-04 DIAGNOSIS — R319 Hematuria, unspecified: Secondary | ICD-10-CM | POA: Diagnosis not present

## 2018-11-18 ENCOUNTER — Ambulatory Visit (INDEPENDENT_AMBULATORY_CARE_PROVIDER_SITE_OTHER): Payer: 59 | Admitting: Urology

## 2018-11-18 DIAGNOSIS — R3121 Asymptomatic microscopic hematuria: Secondary | ICD-10-CM | POA: Diagnosis not present

## 2018-11-18 DIAGNOSIS — R109 Unspecified abdominal pain: Secondary | ICD-10-CM

## 2018-11-25 DIAGNOSIS — R102 Pelvic and perineal pain: Secondary | ICD-10-CM | POA: Diagnosis not present

## 2018-12-08 HISTORY — PX: BREAST BIOPSY: SHX20

## 2018-12-10 ENCOUNTER — Other Ambulatory Visit: Payer: Self-pay | Admitting: Obstetrics and Gynecology

## 2018-12-10 DIAGNOSIS — R928 Other abnormal and inconclusive findings on diagnostic imaging of breast: Secondary | ICD-10-CM

## 2018-12-15 ENCOUNTER — Ambulatory Visit
Admission: RE | Admit: 2018-12-15 | Discharge: 2018-12-15 | Disposition: A | Discharge status: Home / Self Care | Payer: 59 | Source: Ambulatory Visit | Attending: Obstetrics and Gynecology | Admitting: Obstetrics and Gynecology

## 2018-12-15 ENCOUNTER — Other Ambulatory Visit: Payer: Self-pay | Admitting: Obstetrics and Gynecology

## 2018-12-15 ENCOUNTER — Other Ambulatory Visit: Payer: Self-pay

## 2018-12-15 DIAGNOSIS — R928 Other abnormal and inconclusive findings on diagnostic imaging of breast: Secondary | ICD-10-CM

## 2018-12-15 DIAGNOSIS — N6489 Other specified disorders of breast: Secondary | ICD-10-CM

## 2018-12-16 ENCOUNTER — Other Ambulatory Visit: Payer: Self-pay | Admitting: Obstetrics and Gynecology

## 2018-12-16 ENCOUNTER — Ambulatory Visit
Admission: RE | Admit: 2018-12-16 | Discharge: 2018-12-16 | Disposition: A | Discharge status: Home / Self Care | Payer: 59 | Source: Ambulatory Visit | Attending: Obstetrics and Gynecology | Admitting: Obstetrics and Gynecology

## 2018-12-16 ENCOUNTER — Other Ambulatory Visit: Payer: Self-pay

## 2018-12-16 DIAGNOSIS — N6489 Other specified disorders of breast: Secondary | ICD-10-CM

## 2019-02-03 ENCOUNTER — Encounter: Payer: 59 | Admitting: Obstetrics & Gynecology

## 2019-04-24 ENCOUNTER — Other Ambulatory Visit: Payer: Self-pay

## 2019-04-24 DIAGNOSIS — Z20822 Contact with and (suspected) exposure to covid-19: Secondary | ICD-10-CM

## 2019-04-26 LAB — NOVEL CORONAVIRUS, NAA: SARS-CoV-2, NAA: NOT DETECTED

## 2020-05-12 ENCOUNTER — Other Ambulatory Visit: Payer: Self-pay

## 2020-05-12 ENCOUNTER — Encounter: Payer: Self-pay | Admitting: Pulmonary Disease

## 2020-05-12 ENCOUNTER — Ambulatory Visit: Payer: 59 | Admitting: Pulmonary Disease

## 2020-05-12 VITALS — BP 134/72 | HR 82 | Ht 66.5 in | Wt 222.0 lb

## 2020-05-12 DIAGNOSIS — R918 Other nonspecific abnormal finding of lung field: Secondary | ICD-10-CM

## 2020-05-12 DIAGNOSIS — R0602 Shortness of breath: Secondary | ICD-10-CM | POA: Diagnosis not present

## 2020-05-12 NOTE — Progress Notes (Signed)
Jillian Cruz    [id number]    Nov 23, 1970  Primary Care Physician:Golding, Jonny Ruiz, MD  Referring Physician: Assunta Found, MD [address] 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 Belimumab (BENLYSTA) 200 MG/ML SOAJ Per month  . cholecalciferol (VITAMIN D) 1000 UNITS tablet Take 1,000 Units by mouth daily.   Marland Kitchen ibuprofen (ADVIL) 200 MG tablet Take 4 tablets (800 mg total) by mouth every 8 (eight) hours as needed for moderate pain.  . [DISCONTINUED] azaTHIOprine (IMURAN) 50 MG tablet Take 50 mg by mouth every evening.  . [DISCONTINUED] oxyCODONE-acetaminophen (ROXICET) 5-325 MG tablet Take 1-2 tablets by mouth every 4 (four) hours as needed for severe pain.   No facility-administered encounter medications on file as of 05/12/2020.    Allergies as of 05/12/2020 - Review Complete 05/12/2020  Allergen Reaction Noted  . Bee venom Anaphylaxis 04/03/2016    Past Medical History:  Diagnosis Date  . ANA positive   . Chronic joint pain    all joints  . Dyslipidemia   . Dysmenorrhea   . Endometrial polyp   . Family history of cardiac disorder   . H/O pleurisy    intermittant due to lupus  . Heart murmur   . History of cardiovascular stress test    2009  per dr Rennis Golden note (cardiologist) --  showed fixed anterior defect consistant w/ breast attenuation artifact, exercise capacity limited bp remarkably elevated to 223/84  . History of esophagitis    erosive esophagitis and duodenitis 2014  . History of Helicobacter pylori infection     2014  . History of recurrent pneumonia   . Lupus (systemic lupus erythematosus) (HCC) dx 2011 --  ANA positive/  dsDNA   rheumatologist-  dr patal/ dr Nydia Bouton [facility])  lov in epic under care everywhere  . Menorrhagia   . PONV (postoperative nausea and vomiting)   . Raynaud disease 2011  . Recurrent oral ulcers    due to lupus  . Wears contact lenses     Past Surgical History:  Procedure Laterality Date  . CESAREAN SECTION  08/06/2001  . DILITATION & CURRETTAGE/HYSTROSCOPY WITH NOVASURE ABLATION N/A 04/05/2016   Procedure: DILATATION & CURETTAGE/HYSTEROSCOPY WITH NOVASURE ABLATION;  Surgeon: Richarda Overlie, MD;  Location: [facility];  Service: Gynecology;  Laterality: N/A;  . ESOPHAGOGASTRODUODENOSCOPY N/A 09/03/2012   Procedure: ESOPHAGOGASTRODUODENOSCOPY (EGD);  Surgeon: Malissa Hippo, MD;  [facility];  Service: Endoscopy;  Laterality: N/A;  325  . LAPAROSCOPIC CHOLECYSTECTOMY  06/07/2010  . LAPAROSCOPIC TUBAL LIGATION Bilateral 04/05/2016   Procedure: LAPAROSCOPIC TUBAL LIGATION with filshie clips;  Surgeon: Richarda Overlie, MD;  Location: [facility];  Service: Gynecology;  Laterality: Bilateral;  . NEGATIVE SLEEP STUDY  2010  per pt  . TRANSTHORACIC ECHOCARDIOGRAM  12/08/2010   ef 55-60%/  mild MR/  mild LAE    Family History  Problem Relation Age of Onset  . Heart disease Father   . Kidney cancer Father     Social History  Socioeconomic History  . Marital status: Divorced    Spouse name: Not on file  . Number of children: Not on file  . Years of education: Not on file  . Highest education level: Not on file  Occupational History  . Occupation: Firefighter  Tobacco Use  . Smoking status: Current Every Day Smoker    Packs/day: 1.00    Years: 29.00    Pack years: 29.00    Types: Cigarettes  . Smokeless tobacco: Never Used  Substance and Sexual Activity  . Alcohol use: No  . Drug use: No  . Sexual activity: Not on file   Other Topics Concern  . Not on file  Social History Narrative  . Not on file   Social Determinants of Health   Financial Resource Strain:   . Difficulty of Paying Living Expenses: Not on file  Food Insecurity:   . Worried About Programme researcher, broadcasting/film/video in the Last Year: Not on file  . Ran Out of Food in the Last Year: Not on file  Transportation Needs:   . Lack of Transportation (Medical): Not on file  . Lack of Transportation (Non-Medical): Not on file  Physical Activity:   . Days of Exercise per Week: Not on file  . Minutes of Exercise per Session: Not on file  Stress:   . Feeling of Stress : Not on file  Social Connections:   . Frequency of Communication with Friends and Family: Not on file  . Frequency of Social Gatherings with Friends and Family: Not on file  . Attends Religious Services: Not on file  . Active Member of Clubs or Organizations: Not on file  . Attends Banker Meetings: Not on file  . Marital Status: Not on file  Intimate Partner Violence:   . Fear of Current or Ex-Partner: Not on file  . Emotionally Abused: Not on file  . Physically Abused: Not on file  . Sexually Abused: Not on file    Review of Systems  Respiratory: Positive for shortness of breath.     Vitals:   05/12/20 1635  BP: 134/72  Pulse: 82  SpO2: 98%     Physical Exam Constitutional:      Appearance: She is obese.  HENT:     Right Ear: There is no impacted cerumen.     Left Ear: There is no impacted cerumen.     Mouth/Throat:     Mouth: Mucous membranes are moist.  Eyes:     General:        Right eye: No discharge.        Left eye: No discharge.     Pupils: Pupils are equal, round, and reactive to light.  Cardiovascular:     Rate and Rhythm: Normal rate and regular rhythm.     Heart sounds: No murmur heard.  No friction rub.  Pulmonary:     Effort: No respiratory distress.     Breath sounds: No stridor. No wheezing or rhonchi.  Musculoskeletal:     Cervical  back: No rigidity or tenderness.  Neurological:     Mental Status: She is alert.  Psychiatric:        Mood and Affect: Mood normal.    Data Reviewed: Recent CT abdomen 09/03/2018 did reveal groundglass changes at the bases  Assessment:  Progressive shortness of breath  Pleuritic chest pain  History of lupus  Abnormal CT scan of the chest showing groundglass changes at the bases of the lungs  An active smoker  Plan/Recommendations: Obtain pulmonary function test  High-resolution CT scan of the chest  Smoking cessation counseling  Follow-up in 6 weeks  Virl Diamond MD [facility] 05/12/2020, 4:59 PM  CC: Assunta Found, MD

## 2020-05-12 NOTE — Patient Instructions (Addendum)
Shortness of breath  History of lupus 6  Recent CT for stones did reveal some haziness at the base of the lungs  We will get a CT scan of your lungs Obtain pulmonary function tests  Smoking cessation efforts as able   I will see you in 6 weeks

## 2020-06-01 ENCOUNTER — Ambulatory Visit
Admission: RE | Admit: 2020-06-01 | Discharge: 2020-06-01 | Disposition: A | Discharge status: Home / Self Care | Payer: 59 | Source: Ambulatory Visit | Attending: Pulmonary Disease | Admitting: Pulmonary Disease

## 2020-06-01 DIAGNOSIS — R0602 Shortness of breath: Secondary | ICD-10-CM

## 2020-06-01 DIAGNOSIS — R918 Other nonspecific abnormal finding of lung field: Secondary | ICD-10-CM

## 2020-06-08 ENCOUNTER — Telehealth: Payer: Self-pay | Admitting: Pulmonary Disease

## 2020-06-08 NOTE — Telephone Encounter (Signed)
Called and spoke with patient regarding PFT.  PFT was not scheduled at time of check out.  Order placed today for PFT, attempted to schedule prior to her 06/17/20 appointment with Dr. Wynona Neat, no openings available that meet the patients schedule, she gets a monthly infusion for lupus and that was on the day of the opening we had.  Scheduled for 07/05/20 at 4 pm.  Nothing further needed.  Dr. Wynona Neat, Just making you aware that the PFT has not been done, however, has been scheduled for 07/05/20.  Thank you.

## 2020-06-08 NOTE — Addendum Note (Signed)
Addended by: Delrae Rend on: 06/08/2020 03:10 PM   Modules accepted: Orders

## 2020-06-17 ENCOUNTER — Encounter: Payer: Self-pay | Admitting: Pulmonary Disease

## 2020-06-17 ENCOUNTER — Other Ambulatory Visit: Payer: Self-pay

## 2020-06-17 ENCOUNTER — Ambulatory Visit: Payer: 59 | Admitting: Pulmonary Disease

## 2020-06-17 VITALS — BP 122/70 | HR 80 | Temp 98.2°F | Ht 66.5 in | Wt 224.0 lb

## 2020-06-17 DIAGNOSIS — R0602 Shortness of breath: Secondary | ICD-10-CM

## 2020-06-17 DIAGNOSIS — R9389 Abnormal findings on diagnostic imaging of other specified body structures: Secondary | ICD-10-CM

## 2020-06-17 NOTE — Progress Notes (Signed)
Jillian Cruz    [id number]    Jun 23, 1971  Primary Care Physician:Golding, Jonny Ruiz, MD  Referring Physician: Assunta Found, MD [address] [place],  [place] [address]  Chief complaint:   Shortness of breath with activity  HPI:  Worsening shortness of breath Occasional pleuritic chest pains  Recently had a CT scan of the chest showing interstitial process which may be related to DIP however UIP is a consideration but atypical History of lupus  History of lupus On Benlysta monthly injection  Active smoker  Not coughing up any secretions just more short of breath with activity  Has pleurisy for which she uses anti-inflammatory and occasional steroids  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 Belimumab (BENLYSTA) 200 MG/ML SOAJ Per month  . cholecalciferol (VITAMIN D) 1000 UNITS tablet Take 1,000 Units by mouth daily.   Marland Kitchen ibuprofen (ADVIL) 200 MG tablet Take 4 tablets (800 mg total) by mouth every 8 (eight) hours as needed for moderate pain.   No facility-administered encounter medications on file as of 06/17/2020.    Allergies as of 06/17/2020 - Review Complete 06/17/2020  Allergen Reaction Noted  . Bee venom Anaphylaxis 04/03/2016    Past Medical History:  Diagnosis Date  . ANA positive   . Chronic joint pain    all joints  . Dyslipidemia   . Dysmenorrhea   . Endometrial polyp   . Family history of cardiac disorder   . H/O pleurisy    intermittant due to lupus  . Heart murmur   . History of cardiovascular stress test    2009  per dr Rennis Golden note (cardiologist) --  showed fixed anterior defect consistant w/ breast attenuation artifact, exercise capacity limited bp remarkably elevated to 223/84  . History of esophagitis    erosive esophagitis and duodenitis 2014  . History of Helicobacter pylori infection    2014  . History of recurrent pneumonia    . Lupus (systemic lupus erythematosus) (HCC) dx 2011 --  ANA positive/  dsDNA   rheumatologist-  dr patal/ dr Nydia Bouton [facility])  lov in epic under care everywhere  . Menorrhagia   . PONV (postoperative nausea and vomiting)   . Raynaud disease 2011  . Recurrent oral ulcers    due to lupus  . Wears contact lenses     Past Surgical History:  Procedure Laterality Date  . CESAREAN SECTION  08/06/2001  . DILITATION & CURRETTAGE/HYSTROSCOPY WITH NOVASURE ABLATION N/A 04/05/2016   Procedure: DILATATION & CURETTAGE/HYSTEROSCOPY WITH NOVASURE ABLATION;  Surgeon: Richarda Overlie, MD;  Location: [facility];  Service: Gynecology;  Laterality: N/A;  . ESOPHAGOGASTRODUODENOSCOPY N/A 09/03/2012   Procedure: ESOPHAGOGASTRODUODENOSCOPY (EGD);  Surgeon: Malissa Hippo, MD;  [facility];  Service: Endoscopy;  Laterality: N/A;  325  . LAPAROSCOPIC CHOLECYSTECTOMY  06/07/2010  . LAPAROSCOPIC TUBAL LIGATION Bilateral 04/05/2016   Procedure: LAPAROSCOPIC TUBAL LIGATION with filshie clips;  Surgeon: Richarda Overlie, MD;  Location: [facility];  Service: Gynecology;  Laterality: Bilateral;  . NEGATIVE SLEEP STUDY  2010  per pt  . TRANSTHORACIC ECHOCARDIOGRAM  12/08/2010   ef 55-60%/  mild MR/  mild LAE    Family History  Problem Relation Age of Onset  . Heart disease Father   . Kidney cancer Father     Social History   Socioeconomic History  .  Marital status: Divorced    Spouse name: Not on file  . Number of children: Not on file  . Years of education: Not on file  . Highest education level: Not on file  Occupational History  . Occupation: Firefighter  Tobacco Use  . Smoking status: Current Every Day Smoker    Packs/day: 1.00    Years: 29.00    Pack years: 29.00    Types: Cigarettes  . Smokeless tobacco: Never Used  Substance and Sexual Activity  . Alcohol use: No  . Drug use: No  . Sexual activity: Not on file  Other Topics Concern  . Not on file   Social History Narrative  . Not on file   Social Determinants of Health   Financial Resource Strain: Not on file  Food Insecurity: Not on file  Transportation Needs: Not on file  Physical Activity: Not on file  Stress: Not on file  Social Connections: Not on file  Intimate Partner Violence: Not on file    Review of Systems  Respiratory: Positive for shortness of breath.     Vitals:   06/17/20 1626  BP: 122/70  Pulse: 80  Temp: 98.2 F (36.8 C)  SpO2: 99%     Physical Exam Constitutional:      Appearance: She is obese.  Cardiovascular:     Rate and Rhythm: Normal rate and regular rhythm.     Heart sounds: No murmur heard. No friction rub.  Pulmonary:     Effort: No respiratory distress.     Breath sounds: No stridor. No wheezing or rhonchi.  Musculoskeletal:     Cervical back: No rigidity or tenderness.  Neurological:     Mental Status: She is alert.  Psychiatric:        Mood and Affect: Mood normal.    Data Reviewed: Recent CT abdomen 09/03/2018 did reveal groundglass changes at the bases  High-resolution CT scan of the chest was reviewed with the patient showing an interstitial process-DIP is a consideration  Assessment:  Progressive shortness of breath  Pleuritic chest pain  History of lupus  Abnormal CT scan of the chest showing groundglass changes at the bases of the lungs-high-resolution CT scan of the chest confirms an interstitial process  An active smoker -Has had difficulty quitting  Plan/Recommendations: Obtain pulmonary function test -This is scheduled for 1228  Smoking cessation counseling  I will reach out to Dr. Dierdre Forth to see what he feels about proceeding with a wedge biopsy of the lung which will be the next investigation to attempt to clarify ongoing interstitial process  Follow-up in 6 weeks  Virl Diamond MD [facility] 06/17/2020, 4:31 PM  CC: Assunta Found, MD

## 2020-06-17 NOTE — Patient Instructions (Signed)
Abnormal CT scan of the chest with groundglass changes  Unclear what this is related to at the present time, suspicion is still smoking related disease And yes, it could be an interstitial process related to your rheumatologic condition-lupus  I will get in touch with Dr. Dierdre Forth  Next evaluation will be your PFT and then after that we may need a lung biopsy  We will be in touch with you after the PFT, if you do not hear from [place] a week after you've had it done just give [place] a call  Tentatively we will see you in 6 weeks

## 2020-07-05 ENCOUNTER — Ambulatory Visit (INDEPENDENT_AMBULATORY_CARE_PROVIDER_SITE_OTHER): Payer: 59 | Admitting: Pulmonary Disease

## 2020-07-05 ENCOUNTER — Other Ambulatory Visit: Payer: Self-pay

## 2020-07-05 DIAGNOSIS — R0602 Shortness of breath: Secondary | ICD-10-CM | POA: Diagnosis not present

## 2020-07-05 LAB — PULMONARY FUNCTION TEST
DL/VA % pred: 81 %
DL/VA: 4.04 ml/min/mmHg/L
DLCO cor % pred: 66 %
DLCO cor: 17.57 ml/min/mmHg
DLCO unc % pred: 66 %
DLCO unc: 17.57 ml/min/mmHg
FEF 25-75 Post: 2.5 L/sec
FEF 25-75 Pre: 1.93 L/sec
FEF2575-%Change-Post: 29 %
FEF2575-%Pred-Post: 85 %
FEF2575-%Pred-Pre: 66 %
FEV1-%Change-Post: 5 %
FEV1-%Pred-Post: 84 %
FEV1-%Pred-Pre: 80 %
FEV1-Post: 2.49 L
FEV1-Pre: 2.37 L
FEV1FVC-%Change-Post: 6 %
FEV1FVC-%Pred-Pre: 95 %
FEV6-%Change-Post: -1 %
FEV6-%Pred-Post: 83 %
FEV6-%Pred-Pre: 84 %
FEV6-Post: 3.05 L
FEV6-Pre: 3.09 L
FEV6FVC-%Pred-Post: 100 %
FEV6FVC-%Pred-Pre: 100 %
FVC-%Change-Post: -1 %
FVC-%Pred-Post: 82 %
FVC-%Pred-Pre: 83 %
FVC-Post: 3.05 L
FVC-Pre: 3.09 L
Post FEV1/FVC ratio: 82 %
Post FEV6/FVC ratio: 100 %
Pre FEV1/FVC ratio: 77 %
Pre FEV6/FVC Ratio: 100 %
RV % pred: 120 %
RV: 2.19 L
TLC % pred: 100 %
TLC: 5.28 L

## 2020-07-05 NOTE — Progress Notes (Signed)
Full PFT performed today. °

## 2020-08-04 ENCOUNTER — Ambulatory Visit: Payer: 59 | Admitting: Pulmonary Disease

## 2020-08-04 ENCOUNTER — Other Ambulatory Visit: Payer: Self-pay

## 2020-08-04 ENCOUNTER — Encounter: Payer: Self-pay | Admitting: Pulmonary Disease

## 2020-08-04 VITALS — BP 126/68 | HR 78 | Temp 98.0°F | Ht 67.0 in | Wt 227.4 lb

## 2020-08-04 DIAGNOSIS — J849 Interstitial pulmonary disease, unspecified: Secondary | ICD-10-CM

## 2020-08-04 NOTE — Progress Notes (Signed)
Jillian Cruz    [id number]    12-Oct-1970  Primary Care Physician:Golding, Jonny Ruiz, MD  Referring Physician: Assunta Found, MD [address] 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 Belimumab (BENLYSTA) 200 MG/ML SOAJ Per month  . cholecalciferol (VITAMIN D) 1000 UNITS tablet Take 1,000 Units by mouth daily.   Marland Kitchen ibuprofen (ADVIL) 200 MG tablet Take 4 tablets (800 mg total) by mouth every 8 (eight) hours as needed for moderate pain.   No facility-administered encounter medications on file as of 08/04/2020.    Allergies as of 08/04/2020 - Review Complete 08/04/2020  Allergen Reaction Noted  . Bee venom Anaphylaxis 04/03/2016    Past Medical History:  Diagnosis Date  . ANA positive   . Chronic joint pain    all joints  . Dyslipidemia   . Dysmenorrhea   . Endometrial polyp   . Family history of cardiac disorder   . H/O pleurisy    intermittant due to lupus  . Heart murmur   . History of cardiovascular stress test    2009  per dr Rennis Golden note (cardiologist) --  showed fixed anterior defect consistant w/ breast attenuation artifact, exercise capacity limited bp remarkably elevated to 223/84  . History of esophagitis    erosive esophagitis and duodenitis 2014  . History of Helicobacter pylori infection    2014  . History of  recurrent pneumonia   . Lupus (systemic lupus erythematosus) (HCC) dx 2011 --  ANA positive/  dsDNA   rheumatologist-  dr patal/ dr Nydia Bouton [facility])  lov in epic under care everywhere  . Menorrhagia   . PONV (postoperative nausea and vomiting)   . Raynaud disease 2011  . Recurrent oral ulcers    due to lupus  . Wears contact lenses     Past Surgical History:  Procedure Laterality Date  . CESAREAN SECTION  08/06/2001  . DILITATION & CURRETTAGE/HYSTROSCOPY WITH NOVASURE ABLATION N/A 04/05/2016   Procedure: DILATATION & CURETTAGE/HYSTEROSCOPY WITH NOVASURE ABLATION;  Surgeon: Richarda Overlie, MD;  Location: [facility];  Service: Gynecology;  Laterality: N/A;  . ESOPHAGOGASTRODUODENOSCOPY N/A 09/03/2012   Procedure: ESOPHAGOGASTRODUODENOSCOPY (EGD);  Surgeon: Malissa Hippo, MD;  [facility];  Service: Endoscopy;  Laterality: N/A;  325  . LAPAROSCOPIC CHOLECYSTECTOMY  06/07/2010  . LAPAROSCOPIC TUBAL LIGATION Bilateral 04/05/2016   Procedure: LAPAROSCOPIC TUBAL LIGATION with filshie clips;  Surgeon: Richarda Overlie, MD;  Location: [facility];  Service: Gynecology;  Laterality: Bilateral;  . NEGATIVE SLEEP STUDY  2010  per pt  . TRANSTHORACIC ECHOCARDIOGRAM  12/08/2010   ef 55-60%/  mild MR/  mild LAE    Family History  Problem Relation Age of Onset  . Heart disease Father   . Kidney cancer Father  Social History   Socioeconomic History  . Marital status: Divorced    Spouse name: Not on file  . Number of children: Not on file  . Years of education: Not on file  . Highest education level: Not on file  Occupational History  . Occupation: Firefighter  Tobacco Use  . Smoking status: Current Every Day Smoker    Packs/day: 1.00    Years: 29.00    Pack years: 29.00    Types: Cigarettes  . Smokeless tobacco: Never Used  Substance and Sexual Activity  . Alcohol use: No  . Drug use: No  . Sexual activity: Not on file  Other Topics Concern   . Not on file  Social History Narrative  . Not on file   Social Determinants of Health   Financial Resource Strain: Not on file  Food Insecurity: Not on file  Transportation Needs: Not on file  Physical Activity: Not on file  Stress: Not on file  Social Connections: Not on file  Intimate Partner Violence: Not on file    Review of Systems  Respiratory: Positive for shortness of breath.     Vitals:   08/04/20 1526  BP: 126/68  Pulse: 78  Temp: 98 F (36.7 C)  SpO2: 99%     Physical Exam Constitutional:      Appearance: She is obese.  HENT:     Head: Normocephalic and atraumatic.     Mouth/Throat:     Mouth: Mucous membranes are moist.  Cardiovascular:     Rate and Rhythm: Normal rate and regular rhythm.     Heart sounds: No murmur heard. No friction rub.  Pulmonary:     Effort: No respiratory distress.     Breath sounds: No stridor. No wheezing or rhonchi.  Musculoskeletal:     Cervical back: No rigidity or tenderness.  Neurological:     Mental Status: She is alert.  Psychiatric:        Mood and Affect: Mood normal.    Data Reviewed: Recent CT abdomen 09/03/2018 did reveal groundglass changes at the bases  High-resolution CT scan of the chest was reviewed with the patient showing an interstitial process-DIP is a consideration  PFT was reviewed, no obstruction, no restriction, mild reduction in diffusing capacity When compared with a PFT performed in 2012, no significant changes   Assessment:  Progressive shortness of breath  Pleuritic chest pain  History of lupus  Abnormal CT scan of the chest showing groundglass changes at the bases of the lungs-high-resolution CT scan of the chest confirms an interstitial process  An active smoker -Has had difficulty quitting, continues to cut down  Plan/Recommendations:  PFT stable from 2012, repeating PFT in in 1 year will be appropriate  Schedule for repeat high-resolution CT in 6 months  Smoking  cessation counseling  Graded exercises as tolerated  Follow-up in 4 to 5 months  Virl Diamond MD [facility] 08/04/2020, 3:37 PM  CC: Assunta Found, MD

## 2020-08-04 NOTE — Patient Instructions (Signed)
Your breathing study reassuringly has been stable when compared with one performed in 2012  Repeating your CT scan in about 6 months from the previous is appropriate  Continue working on smoking cessation  Regular graded exercises I think will help  I will see you following the high-resolution CT scan in May  Call with any significant concerns

## 2020-11-11 ENCOUNTER — Ambulatory Visit (HOSPITAL_COMMUNITY)
Admission: RE | Admit: 2020-11-11 | Discharge: 2020-11-11 | Disposition: A | Discharge status: Home / Self Care | Payer: 59 | Source: Ambulatory Visit | Attending: Pulmonary Disease | Admitting: Pulmonary Disease

## 2020-11-11 ENCOUNTER — Other Ambulatory Visit: Payer: Self-pay

## 2020-11-11 ENCOUNTER — Encounter (HOSPITAL_COMMUNITY): Payer: Self-pay

## 2020-11-11 DIAGNOSIS — J849 Interstitial pulmonary disease, unspecified: Secondary | ICD-10-CM | POA: Diagnosis not present

## 2020-11-29 ENCOUNTER — Other Ambulatory Visit: Payer: Self-pay

## 2020-11-29 ENCOUNTER — Encounter: Payer: Self-pay | Admitting: Primary Care

## 2020-11-29 ENCOUNTER — Ambulatory Visit (INDEPENDENT_AMBULATORY_CARE_PROVIDER_SITE_OTHER): Payer: 59 | Admitting: Primary Care

## 2020-11-29 DIAGNOSIS — J8410 Pulmonary fibrosis, unspecified: Secondary | ICD-10-CM

## 2020-11-29 NOTE — Patient Instructions (Addendum)
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 Use nicotine replacement therapy. We can also refer you to pharmacist for additional smoking cessation education   Recommendations: PFTs in 6 months   Follow-up: 6 months with Dr. Wynona Neat

## 2020-11-29 NOTE — Progress Notes (Signed)
$'@Patient'p$  ID: Jillian Cruz, female    DOB: 1971-02-14, 50 y.o.   MRN: [id number]  Chief Complaint  Patient presents with  . Follow-up    Patient feels good overall, no concerns at this time.     Referring provider: Sharilyn Sites, MD  HPI: 50 year old female, smoker.  Diffident for lupus, H. pylori infection, obesity, tobacco dependence.  Patient of Dr. Ander Slade, last seen on 08/04/20.  Previous [facility] pulmonary encounter: 08/04/20- Dr. 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  She experiences chest tightness in her back when lying down at night. She is still smoking 1 pack per day, she is thinking about quitting. Her lung function has remained relatively stable since 2012. CT chest on 11/13/2020 shows unchanged pattern of mild pulmonary fibrosis.  No evidence of bronchiectasis or honeycombing.  There is background of fine centrilobular nodularity consistent with smoking-related respiratory bronchiolitis. She has a hx of lupus and is on monthly Benlysta injections.    Pulmonary function testing: 12/06/10- FVC 3.79 (96%), FEV1 3.15 (102%), ratio 83, DLCO 29.8 (58%), DLCO/VA 88%  07/05/20 - FVC 3.05 (82%), FEV1 2.49 (84%), ratio 82, DLCO 17.57 (66%), DLCO/VA 82%    Allergies  Allergen Reactions  . Bee Venom Anaphylaxis    Immunization History  Administered Date(s) Administered  . Influenza-Unspecified 04/24/2017  . Moderna Sars-Covid-2  Vaccination 09/25/2019, 10/23/2019  . Tdap 06/07/2005    Past Medical History:  Diagnosis Date  . ANA positive   . Chronic joint pain    all joints  . Dyslipidemia   . Dysmenorrhea   . Endometrial polyp   . Family history of cardiac disorder   . H/O pleurisy    intermittant due to lupus  . Heart murmur   . History of cardiovascular stress test    2009  per dr Debara Pickett note (cardiologist) --  showed fixed anterior defect consistant w/ breast attenuation artifact, exercise capacity limited bp remarkably elevated to 223/84  . History of esophagitis    erosive esophagitis and duodenitis 2014  . History of Helicobacter pylori infection    2014  . History of recurrent pneumonia   . Lupus (systemic lupus erythematosus) ([place]) dx 2011 --  ANA positive/  dsDNA   rheumatologist-  dr patal/ dr Annabelle Harman [facility])  lov in epic under care everywhere  . Menorrhagia   . PONV (postoperative nausea and vomiting)   . Raynaud disease 2011  . Recurrent oral ulcers    due to lupus  . Wears contact lenses     Tobacco History: Social History   Tobacco Use  Smoking Status Current Every Day Smoker  . Packs/day: 1.00  . Years: 29.00  . Pack years: 29.00  . Types: Cigarettes  Smokeless Tobacco Never Used   Ready to quit: Not Answered Counseling given: Not Answered   Outpatient Medications Prior to Visit  Medication Sig Dispense Refill  . Belimumab (BENLYSTA) 200 MG/ML SOAJ  Per month    . cholecalciferol (VITAMIN D) 1000 UNITS tablet Take 1,000 Units by mouth daily.     Marland Kitchen ibuprofen (ADVIL) 200 MG tablet Take 4 tablets (800 mg total) by mouth every 8 (eight) hours as needed for moderate pain. 30 tablet 1   No facility-administered medications prior to visit.    Review of Systems  Review of Systems  Constitutional: Negative.   HENT: Negative.   Respiratory: Positive for chest tightness.    Physical Exam  BP 124/80 (BP Location: Left Arm, Patient Position: Sitting, Cuff Size: Normal)   Pulse  85   Temp 97.6 F (36.4 C) (Temporal)   Ht 5' 6.5" (1.689 m)   Wt 222 lb 9.6 oz (101 kg)   SpO2 98%   BMI 35.39 kg/m  Physical Exam Constitutional:      Appearance: Normal appearance.  HENT:     Head: Normocephalic and atraumatic.  Cardiovascular:     Rate and Rhythm: Normal rate and regular rhythm.  Pulmonary:     Effort: Pulmonary effort is normal.     Breath sounds: Normal breath sounds. No wheezing or rales.     Comments: CTA Musculoskeletal:     Cervical back: Normal range of motion and neck supple.  Skin:    General: Skin is warm and dry.  Neurological:     General: No focal deficit present.     Mental Status: She is alert and oriented to person, place, and time. Mental status is at baseline.  Psychiatric:        Mood and Affect: Mood normal.        Behavior: Behavior normal.        Thought Content: Thought content normal.        Judgment: Judgment normal.      Lab Results:  CBC    Component Value Date/Time   WBC 9.1 01/23/2013 0927   RBC 4.69 01/23/2013 0927   HGB 14.9 04/05/2016 0654   HCT 43.0 01/23/2013 0930   PLT 353 01/23/2013 0927   MCV 86.4 01/23/2013 0927   MCH 30.7 01/23/2013 0927   MCHC 35.6 01/23/2013 0927   RDW 12.4 01/23/2013 0927   LYMPHSABS 2.1 01/23/2013 0927   MONOABS 0.5 01/23/2013 0927   EOSABS 0.2 01/23/2013 0927   BASOSABS 0.1 01/23/2013 0927    BMET    Component Value Date/Time   NA 140 01/23/2013 0930   K 4.2 01/23/2013 0930   CL 102 01/23/2013 0930   CO2 28 06/06/2010 1510   GLUCOSE 93 01/23/2013 0930   BUN 10 01/23/2013 0930   CREATININE 0.80 01/23/2013 0930   CALCIUM 9.4 06/06/2010 1510   GFRNONAA >60 06/06/2010 1510   GFRAA  06/06/2010 1510    >60        The eGFR has been calculated using the MDRD equation. This calculation has not been validated in all clinical situations. eGFR's persistently <60 mL/min signify possible Chronic Kidney Disease.    BNP No results found for: BNP  ProBNP    Component  Value Date/Time   PROBNP 8.5 01/23/2013 0926    Imaging: CT Chest High Resolution  Result Date: 11/13/2020 CLINICAL DATA:  Interstitial lung disease, history of lupus, smoker EXAM: CT CHEST WITHOUT CONTRAST TECHNIQUE: Multidetector CT imaging of the chest was performed following the standard protocol without intravenous contrast. High resolution imaging of the lungs, as well as inspiratory and expiratory imaging, was performed. COMPARISON:  09/30/2019, 01/23/2013, 12/19/2010 FINDINGS: Cardiovascular: Scattered aortic atherosclerosis. Normal  heart size. Scattered coronary artery calcifications. No pericardial effusion. Mediastinum/Nodes: No enlarged mediastinal, hilar, or axillary lymph nodes. Thymic remnant in the anterior mediastinum. Thyroid gland, trachea, and esophagus demonstrate no significant findings. Lungs/Pleura: Lobular air trapping on expiratory phase imaging. Unchanged pattern of mild pulmonary fibrosis characterized by bibasilar interlobular septal thickening and ground-glass. No evidence of bronchiolectasis or honeycombing. There is a background of very fine centrilobular nodularity, most conspicuous in the lung apices. No pleural effusion or pneumothorax. Upper Abdomen: No acute abnormality. Musculoskeletal: No chest wall mass or suspicious bone lesions identified. IMPRESSION: 1. Unchanged pattern of mild pulmonary fibrosis characterized by bibasilar interlobular septal thickening and ground-glass. No evidence of bronchiolectasis or honeycombing. These findings are again in an "indeterminate for UIP" pattern, differential considerations broad and favoring NSIP, as well as smoking-related interstitial lung disease such as desquamative interstitial pneumonitis. UIP not excluded. 2. There is a background of very fine centrilobular nodularity, most conspicuous in the lung apices. Findings are consistent with smoking-related respiratory bronchiolitis. 3. Lobular air trapping on expiratory phase  imaging, consistent with small airways disease. 4. Coronary artery disease. Aortic Atherosclerosis (ICD10-I70.0). Electronically Signed   By: Eddie Candle M.D.   On: 11/13/2020 17:51     Assessment & Plan:   Pulmonary fibrosis ([place]) -  CT chest on 11/13/2020 shows unchanged pattern of mild pulmonary fibrosis.  No evidence of bronchiectasis or honeycombing.  There is background of fine centrilobular nodularity consistent with smoking-related respiratory bronchiolitis.   - Pulmonary function testing in December 2022 is relatively stable when compared to 2012 - Discussed imaging results with Dr. Ander Slade. He feels nodularity is related to her smoking, strongly encourage complete smoking cessation. If fibrosis were to worsen on imaging would need to consider open lung bx for tissue sampling  - Recommend follow-up in 6 months with repeat PFTs    Martyn Ehrich, NP 11/29/2020

## 2020-11-29 NOTE — Assessment & Plan Note (Addendum)
-    CT chest on 11/13/2020 shows unchanged pattern of mild pulmonary fibrosis.  No evidence of bronchiectasis or honeycombing.  There is background of fine centrilobular nodularity consistent with smoking-related respiratory bronchiolitis.   - Pulmonary function testing in December 2022 is relatively stable when compared to 2012 - Discussed imaging results with Dr. Wynona Neat. He feels nodularity is related to her smoking, strongly encourage complete smoking cessation. If fibrosis were to worsen on imaging would need to consider open lung bx for tissue sampling  - Recommend follow-up in 6 months with repeat PFTs

## 2022-05-28 ENCOUNTER — Encounter (INDEPENDENT_AMBULATORY_CARE_PROVIDER_SITE_OTHER): Payer: Self-pay | Admitting: Gastroenterology

## 2023-09-28 ENCOUNTER — Ambulatory Visit
Admission: RE | Admit: 2023-09-28 | Discharge: 2023-09-28 | Disposition: A | Discharge status: Home / Self Care | Payer: Self-pay | Source: Ambulatory Visit | Attending: Physician Assistant

## 2023-09-28 VITALS — BP 125/82 | HR 78 | Temp 98.3°F | Resp 20

## 2023-09-28 DIAGNOSIS — J019 Acute sinusitis, unspecified: Secondary | ICD-10-CM | POA: Diagnosis not present

## 2023-09-28 MED ORDER — FLUCONAZOLE 150 MG PO TABS: 150.0000 mg | ORAL_TABLET | Freq: Every day | ORAL | 0 refills | Status: DC

## 2023-09-28 MED ORDER — AMOXICILLIN-POT CLAVULANATE 875-125 MG PO TABS: 1.0000 | ORAL_TABLET | Freq: Two times a day (BID) | ORAL | 0 refills | Status: DC

## 2023-09-28 MED ORDER — BENZONATATE 100 MG PO CAPS: 100.0000 mg | ORAL_CAPSULE | Freq: Three times a day (TID) | ORAL | 0 refills | Status: DC

## 2023-09-28 NOTE — ED Triage Notes (Signed)
 Pt reports she has drainage, throat pain, and facial pressure x 1 week.

## 2023-09-28 NOTE — ED Provider Notes (Signed)
 [facility]    CSN: [id number] Arrival date & time: 09/28/23  1253      History   Chief Complaint Chief Complaint  Patient presents with   Nasal Congestion    Sinus infection - Entered by patient    HPI Jillian Minner is a 53 y.o. female.   Patient complains of 8 days of sinus pressure and pain.  She reports initially she was experiencing a fever but that resolved after a day 4.  She reports mild intermittent cough.  Patient reports drainage and congestion is worse first thing in the morning.    Past Medical History:  Diagnosis Date   ANA positive    Chronic joint pain    all joints   Dyslipidemia    Dysmenorrhea    Endometrial polyp    Family history of cardiac disorder    H/O pleurisy    intermittant due to lupus   Heart murmur    History of cardiovascular stress test    2009  per dr Rennis Golden note (cardiologist) --  showed fixed anterior defect consistant w/ breast attenuation artifact, exercise capacity limited bp remarkably elevated to 223/84   History of esophagitis    erosive esophagitis and duodenitis 2014   History of Helicobacter pylori infection    2014   History of recurrent pneumonia    Lupus (systemic lupus erythematosus) ([facility]) dx 2011 --  ANA positive/  dsDNA   rheumatologist-  dr patal/ dr Nydia Bouton [facility])  lov in epic under care everywhere   Menorrhagia    PONV (postoperative nausea and vomiting)    Raynaud disease 2011   Recurrent oral ulcers    due to lupus   Wears contact lenses     Patient Active Problem List   Diagnosis Date Noted   Pulmonary fibrosis (HCC) 11/29/2020   Tobacco dependence 05/18/2013   Dyslipidemia 05/18/2013   Lupus (systemic lupus erythematosus) ([facility]) 05/18/2013   Obesity (BMI 35.0-39.9 without comorbidity) 05/18/2013   H. pylori infection 09/01/2012   Dyspnea 11/14/2010   Lupus anticoagulant disorder ([facility]CC) 11/14/2010    Past Surgical History:  Procedure Laterality Date   CESAREAN SECTION  08/06/2001    DILITATION & CURRETTAGE/HYSTROSCOPY WITH NOVASURE ABLATION N/A 04/05/2016   Procedure: DILATATION & CURETTAGE/HYSTEROSCOPY WITH NOVASURE ABLATION;  Surgeon: Richarda Overlie, MD;  Location: [facility];  Service: Gynecology;  Laterality: N/A;   ESOPHAGOGASTRODUODENOSCOPY N/A 09/03/2012   Procedure: ESOPHAGOGASTRODUODENOSCOPY (EGD);  Surgeon: Malissa Hippo, MD;  [facility];  Service: Endoscopy;  Laterality: N/A;  325   LAPAROSCOPIC CHOLECYSTECTOMY  06/07/2010   LAPAROSCOPIC TUBAL LIGATION Bilateral 04/05/2016   Procedure: LAPAROSCOPIC TUBAL LIGATION with filshie clips;  Surgeon: Richarda Overlie, MD;  Location: [facility];  Service: Gynecology;  Laterality: Bilateral;   NEGATIVE SLEEP STUDY  2010  per pt   TRANSTHORACIC ECHOCARDIOGRAM  12/08/2010   ef 55-60%/  mild MR/  mild LAE    OB History   No obstetric history on file.      Home Medications    Prior to Admission medications   Medication Sig Start Date End Date Taking? Authorizing Provider  amoxicillin-clavulanate (AUGMENTIN) 875-125 MG tablet Take 1 tablet by mouth every 12 (twelve) hours. 09/28/23  Yes Ward, Tylene Fantasia, PA-C  benzonatate (TESSALON) 100 MG capsule Take 1 capsule (100 mg total) by mouth every 8 (eight) hours. 09/28/23  Yes Ward, Tylene Fantasia, PA-C  fluconazole (DIFLUCAN) 150 MG tablet Take 1 tablet (150 mg total) by  mouth daily. 09/28/23  Yes Ward, Tylene Fantasia, PA-C  Belimumab (BENLYSTA) 200 MG/ML SOAJ Per month 02/01/17   [provider]  cholecalciferol (VITAMIN D) 1000 UNITS tablet Take 1,000 Units by mouth daily.     [provider]  ibuprofen (ADVIL) 200 MG tablet Take 4 tablets (800 mg total) by mouth every 8 (eight) hours as needed for moderate pain. 04/05/16   Richarda Overlie, MD    Family History Family History  Problem Relation Age of Onset   Heart disease Father    Kidney cancer Father     Social History Social History   Tobacco Use   Smoking  status: Every Day    Current packs/day: 1.00    Average packs/day: 1 pack/day for 29.0 years (29.0 ttl pk-yrs)    Types: Cigarettes   Smokeless tobacco: Never  Vaping Use   Vaping status: Never Used  Substance Use Topics   Alcohol use: No   Drug use: No     Allergies   Bee venom   Review of Systems Review of Systems  Constitutional:  Negative for chills and fever.  HENT:  Positive for congestion, sinus pressure and sinus pain. Negative for ear pain and sore throat.   Eyes:  Negative for pain and visual disturbance.  Respiratory:  Positive for cough. Negative for shortness of breath.   Cardiovascular:  Negative for chest pain and palpitations.  Gastrointestinal:  Negative for abdominal pain and vomiting.  Genitourinary:  Negative for dysuria and hematuria.  Musculoskeletal:  Negative for arthralgias and back pain.  Skin:  Negative for color change and rash.  Neurological:  Negative for seizures and syncope.  All other systems reviewed and are negative.    Physical Exam Triage Vital Signs ED Triage Vitals  Encounter Vitals Group     BP 09/28/23 1259 125/82     Systolic BP Percentile --      Diastolic BP Percentile --      Pulse Rate 09/28/23 1259 78     Resp 09/28/23 1259 20     Temp 09/28/23 1259 98.3 F (36.8 C)     Temp Source 09/28/23 1259 Oral     SpO2 09/28/23 1259 98 %     Weight --      Height --      Head Circumference --      Peak Flow --      Pain Score 09/28/23 1258 7     Pain Loc --      Pain Education --      Exclude from Growth Chart --    No data found.  Updated Vital Signs BP 125/82 (BP Location: Right Arm)   Pulse 78   Temp 98.3 F (36.8 C) (Oral)   Resp 20   SpO2 98%   Visual Acuity Right Eye Distance:   Left Eye Distance:   Bilateral Distance:    Right Eye Near:   Left Eye Near:    Bilateral Near:     Physical Exam Vitals and nursing note reviewed.  Constitutional:      General: She is not in acute distress.     Appearance: She is well-developed.  HENT:     Head: Normocephalic and atraumatic.  Eyes:     Conjunctiva/sclera: Conjunctivae normal.  Cardiovascular:     Rate and Rhythm: Normal rate and regular rhythm.     Heart sounds: No murmur heard. Pulmonary:     Effort: Pulmonary effort is normal. No respiratory distress.  Breath sounds: Normal breath sounds.  Abdominal:     Palpations: Abdomen is soft.     Tenderness: There is no abdominal tenderness.  Musculoskeletal:        General: No swelling.     Cervical back: Neck supple.  Skin:    General: Skin is warm and dry.     Capillary Refill: Capillary refill takes less than 2 seconds.  Neurological:     Mental Status: She is alert.  Psychiatric:        Mood and Affect: Mood normal.      UC Treatments / Results  Labs (all labs ordered are listed, but only abnormal results are displayed) Labs Reviewed - No data to display  EKG   Radiology No results found.  Procedures Procedures (including critical care time)  Medications Ordered in UC Medications - No data to display  Initial Impression / Assessment and Plan / UC Course  I have reviewed the triage vital signs and the nursing notes.  Pertinent labs & imaging results that were available during my care of the patient were reviewed by me and considered in my medical decision making (see chart for details).     Will treat for acute sinusitis.  Antibiotic prescribed.  Supportive care discussed.  Return precautions discussed.  Patient overall well-appearing in no acute distress, lungs clear to auscultation. Final Clinical Impressions(s) / UC Diagnoses   Final diagnoses:  Acute non-recurrent sinusitis, unspecified location     Discharge Instructions      Take antibiotic as prescribed. Recommend Flonase and Mucinex for congestion and drainage. Drink plenty of fluids and rest. Can take Tessalon as needed for cough. If no improvement or symptoms become worse return  for evaluation.   ED Prescriptions     Medication Sig Dispense Auth. Provider   amoxicillin-clavulanate (AUGMENTIN) 875-125 MG tablet Take 1 tablet by mouth every 12 (twelve) hours. 14 tablet Ward, Shanda Bumps Z, PA-C   benzonatate (TESSALON) 100 MG capsule Take 1 capsule (100 mg total) by mouth every 8 (eight) hours. 21 capsule Ward, Shanda Bumps Z, PA-C   fluconazole (DIFLUCAN) 150 MG tablet Take 1 tablet (150 mg total) by mouth daily. 1 tablet Ward, Tylene Fantasia, PA-C      PDMP not reviewed this encounter.   Ward, Tylene Fantasia, PA-C 09/28/23 1320

## 2023-09-28 NOTE — Discharge Instructions (Signed)
 Take antibiotic as prescribed. Recommend Flonase and Mucinex for congestion and drainage. Drink plenty of fluids and rest. Can take Tessalon as needed for cough. If no improvement or symptoms become worse return for evaluation.

## 2023-11-26 ENCOUNTER — Ambulatory Visit (HOSPITAL_COMMUNITY)
Admission: RE | Admit: 2023-11-26 | Discharge: 2023-11-26 | Disposition: A | Discharge status: Home / Self Care | Source: Ambulatory Visit | Attending: Family Medicine | Admitting: Family Medicine

## 2023-11-26 ENCOUNTER — Other Ambulatory Visit (HOSPITAL_COMMUNITY): Payer: Self-pay | Admitting: Family Medicine

## 2023-11-26 DIAGNOSIS — R0789 Other chest pain: Secondary | ICD-10-CM

## 2023-11-27 ENCOUNTER — Encounter: Payer: Self-pay | Admitting: Family Medicine

## 2023-12-10 ENCOUNTER — Other Ambulatory Visit (HOSPITAL_COMMUNITY): Payer: Self-pay | Admitting: Family Medicine

## 2023-12-10 DIAGNOSIS — N644 Mastodynia: Secondary | ICD-10-CM

## 2023-12-31 ENCOUNTER — Encounter (INDEPENDENT_AMBULATORY_CARE_PROVIDER_SITE_OTHER): Payer: Self-pay | Admitting: *Deleted

## 2024-01-07 ENCOUNTER — Encounter (HOSPITAL_COMMUNITY): Payer: Self-pay

## 2024-01-07 ENCOUNTER — Ambulatory Visit (HOSPITAL_COMMUNITY)
Admission: RE | Admit: 2024-01-07 | Discharge: 2024-01-07 | Disposition: A | Discharge status: Home / Self Care | Source: Ambulatory Visit | Attending: Family Medicine | Admitting: Family Medicine

## 2024-01-07 DIAGNOSIS — N644 Mastodynia: Secondary | ICD-10-CM | POA: Insufficient documentation

## 2024-01-08 ENCOUNTER — Telehealth: Payer: Self-pay

## 2024-01-08 NOTE — Telephone Encounter (Signed)
Ok to schedule.  Room : any   Thanks,  Vista Lawman, MD Gastroenterology and Hepatology [facility]

## 2024-01-08 NOTE — Telephone Encounter (Signed)
 Who is your primary care physician: [facility]  Reasons for the colonoscopy: screening   Have you had a colonoscopy before?  no  Do you have family history of colon cancer? no  Previous colonoscopy with polyps removed? no  Do you have a history colorectal cancer?   no  Are you diabetic? If yes, Type 1 or Type 2?    no  Do you have a prosthetic or mechanical heart valve? no  Do you have a pacemaker/defibrillator?   no  Have you had endocarditis/atrial fibrillation? no  Have you had joint replacement within the last 12 months?  no  Do you tend to be constipated or have to use laxatives? no  Do you have any history of drugs or alchohol?  no  Do you use supplemental oxygen?  no  Have you had a stroke or heart attack within the last 6 months? no  Do you take weight loss medication?  no  For female patients: have you had a hysterectomy?  no                                     are you post menopausal?       no                                            do you still have your menstrual cycle? no      Do you take any blood-thinning medications such as: (aspirin, warfarin, Plavix, Aggrenox)  no  If yes we need the name, milligram, dosage and who is prescribing doctor  Current Outpatient Medications on File Prior to Visit  Medication Sig Dispense Refill   amoxicillin -clavulanate (AUGMENTIN ) 875-125 MG tablet Take 1 tablet by mouth every 12 (twelve) hours. 14 tablet 0   Belimumab (BENLYSTA) 200 MG/ML SOAJ Per month     benzonatate  (TESSALON ) 100 MG capsule Take 1 capsule (100 mg total) by mouth every 8 (eight) hours. 21 capsule 0   cholecalciferol (VITAMIN D) 1000 UNITS tablet Take 1,000 Units by mouth daily.      fluconazole  (DIFLUCAN ) 150 MG tablet Take 1 tablet (150 mg total) by mouth daily. 1 tablet 0   ibuprofen  (ADVIL ) 200 MG tablet Take 4 tablets (800 mg total) by mouth every 8 (eight) hours as needed for moderate pain. 30 tablet 1   No current facility-administered  medications on file prior to visit.    Allergies  Allergen Reactions   Bee Venom Anaphylaxis     Pharmacy: [facility] Insurance Name: [facility] [id number]  Best number where you can be reached: [phone]

## 2024-01-13 NOTE — Telephone Encounter (Signed)
 Will call once we get August schedule for provider.

## 2024-01-16 ENCOUNTER — Other Ambulatory Visit (INDEPENDENT_AMBULATORY_CARE_PROVIDER_SITE_OTHER): Payer: Self-pay | Admitting: *Deleted

## 2024-01-16 ENCOUNTER — Encounter (INDEPENDENT_AMBULATORY_CARE_PROVIDER_SITE_OTHER): Payer: Self-pay | Admitting: *Deleted

## 2024-01-16 MED ORDER — PEG 3350-KCL-NA BICARB-NACL 420 G PO SOLR: 4000.0000 mL | Freq: Once | ORAL | 0 refills | Status: AC

## 2024-01-16 NOTE — Telephone Encounter (Signed)
 Referral completed, TCS apt letter sent to PCP

## 2024-01-16 NOTE — Telephone Encounter (Signed)
 Pt has been scheduled for 02/19/24 with Dr.Ahmed. instructions mailed and prep sent to the pharmacy.

## 2024-01-16 NOTE — Telephone Encounter (Signed)
 LMOVM to call back

## 2024-02-19 ENCOUNTER — Encounter (HOSPITAL_COMMUNITY)
Admission: RE | Disposition: A | Discharge status: Home / Self Care | Payer: Self-pay | Source: Home / Self Care | Attending: Gastroenterology

## 2024-02-19 ENCOUNTER — Ambulatory Visit (HOSPITAL_COMMUNITY): Admitting: Anesthesiology

## 2024-02-19 ENCOUNTER — Ambulatory Visit (HOSPITAL_COMMUNITY)
Admission: RE | Admit: 2024-02-19 | Discharge: 2024-02-19 | Disposition: A | Discharge status: Home / Self Care | Attending: Gastroenterology | Admitting: Gastroenterology

## 2024-02-19 ENCOUNTER — Other Ambulatory Visit: Payer: Self-pay

## 2024-02-19 ENCOUNTER — Encounter (INDEPENDENT_AMBULATORY_CARE_PROVIDER_SITE_OTHER): Payer: Self-pay | Admitting: *Deleted

## 2024-02-19 ENCOUNTER — Encounter (HOSPITAL_COMMUNITY): Payer: Self-pay | Admitting: Gastroenterology

## 2024-02-19 DIAGNOSIS — D123 Benign neoplasm of transverse colon: Secondary | ICD-10-CM

## 2024-02-19 DIAGNOSIS — Z1211 Encounter for screening for malignant neoplasm of colon: Secondary | ICD-10-CM

## 2024-02-19 DIAGNOSIS — I7300 Raynaud's syndrome without gangrene: Secondary | ICD-10-CM | POA: Insufficient documentation

## 2024-02-19 DIAGNOSIS — E785 Hyperlipidemia, unspecified: Secondary | ICD-10-CM

## 2024-02-19 DIAGNOSIS — K644 Residual hemorrhoidal skin tags: Secondary | ICD-10-CM | POA: Diagnosis not present

## 2024-02-19 DIAGNOSIS — F17210 Nicotine dependence, cigarettes, uncomplicated: Secondary | ICD-10-CM | POA: Insufficient documentation

## 2024-02-19 DIAGNOSIS — F17290 Nicotine dependence, other tobacco product, uncomplicated: Secondary | ICD-10-CM | POA: Diagnosis not present

## 2024-02-19 DIAGNOSIS — D124 Benign neoplasm of descending colon: Secondary | ICD-10-CM | POA: Insufficient documentation

## 2024-02-19 DIAGNOSIS — D125 Benign neoplasm of sigmoid colon: Secondary | ICD-10-CM | POA: Diagnosis not present

## 2024-02-19 DIAGNOSIS — M329 Systemic lupus erythematosus, unspecified: Secondary | ICD-10-CM | POA: Insufficient documentation

## 2024-02-19 HISTORY — PX: COLONOSCOPY: SHX5424

## 2024-02-19 LAB — HM COLONOSCOPY

## 2024-02-19 SURGERY — COLONOSCOPY
Anesthesia: General

## 2024-02-19 MED ORDER — LACTATED RINGERS IV SOLN: INTRAVENOUS | Status: DC

## 2024-02-19 MED ORDER — PROPOFOL 10 MG/ML IV BOLUS
INTRAVENOUS | Status: DC | PRN
  Administered 2024-02-19 (×4): 100 mg via INTRAVENOUS

## 2024-02-19 MED ORDER — SODIUM CHLORIDE (PF) 0.9 % IJ SOLN
PREFILLED_SYRINGE | INTRAMUSCULAR | Status: DC | PRN
  Administered 2024-02-19 (×2): 1 mL

## 2024-02-19 MED ORDER — LIDOCAINE 2% (20 MG/ML) 5 ML SYRINGE
INTRAMUSCULAR | Status: DC | PRN
  Administered 2024-02-19 (×2): 100 mg via INTRAVENOUS

## 2024-02-19 MED ORDER — PROPOFOL 500 MG/50ML IV EMUL
INTRAVENOUS | Status: DC | PRN
  Administered 2024-02-19 (×2): 150 ug/kg/min via INTRAVENOUS

## 2024-02-19 MED ORDER — PHENYLEPHRINE 80 MCG/ML (10ML) SYRINGE FOR IV PUSH (FOR BLOOD PRESSURE SUPPORT)
PREFILLED_SYRINGE | INTRAVENOUS | Status: DC | PRN
  Administered 2024-02-19 (×4): 80 ug via INTRAVENOUS

## 2024-02-19 NOTE — Anesthesia Preprocedure Evaluation (Addendum)
 Anesthesia Evaluation  Patient identified by MRN, date of birth, ID band Patient awake    Reviewed: Allergy & Precautions, H&P , NPO status , Patient's Chart, lab work & pertinent test results, reviewed documented beta blocker date and time   History of Anesthesia Complications (+) PONV and history of anesthetic complications  Airway Mallampati: II  TM Distance: >3 FB Neck ROM: full    Dental no notable dental hx. (+) Dental Advisory Given, Teeth Intact   Pulmonary Current Smoker   Pulmonary exam normal breath sounds clear to auscultation       Cardiovascular Exercise Tolerance: Good + Valvular Problems/Murmurs MR  Rhythm:regular Rate:Normal + Systolic murmurs Mild MR   Neuro/Psych Raynauds  negative psych ROS   GI/Hepatic negative GI ROS, Neg liver ROS,,,  Endo/Other  negative endocrine ROS    Renal/GU negative Renal ROS  negative genitourinary   Musculoskeletal   Abdominal   Peds  Hematology negative hematology ROS (+)   Anesthesia Other Findings Lupus  Reproductive/Obstetrics negative OB ROS                              Anesthesia Physical Anesthesia Plan  ASA: 3  Anesthesia Plan: General   Post-op Pain Management: Minimal or no pain anticipated   Induction: Intravenous  PONV Risk Score and Plan: Propofol  infusion  Airway Management Planned: Natural Airway and Nasal Cannula  Additional Equipment: None  Intra-op Plan:   Post-operative Plan:   Informed Consent: I have reviewed the patients History and Physical, chart, labs and discussed the procedure including the risks, benefits and alternatives for the proposed anesthesia with the patient or authorized representative who has indicated his/her understanding and acceptance.     Dental Advisory Given  Plan Discussed with: CRNA  Anesthesia Plan Comments:          Anesthesia Quick Evaluation

## 2024-02-19 NOTE — Discharge Instructions (Addendum)
  Discharge instructions Please read the instructions outlined below and refer to this sheet in the next few weeks. These discharge instructions provide you with general information on caring for yourself after you leave the hospital. Your doctor may also give you specific instructions. While your treatment has been planned according to the most current medical practices available, unavoidable complications occasionally occur. If you have any problems or questions after discharge, please call your doctor. ACTIVITY You may resume your regular activity but move at a slower pace for the next 24 hours.  Take frequent rest periods for the next 24 hours.  Walking will help expel (get rid of) the air and reduce the bloated feeling in your abdomen.  No driving for 24 hours (because of the anesthesia (medicine) used during the test).  You may shower.  Do not sign any important legal documents or operate any machinery for 24 hours (because of the anesthesia used during the test).  NUTRITION Drink plenty of fluids.  You may resume your normal diet.  Begin with a light meal and progress to your normal diet.  Avoid alcoholic beverages for 24 hours or as instructed by your caregiver.  MEDICATIONS You may resume your normal medications unless your caregiver tells you otherwise.  WHAT YOU CAN EXPECT TODAY You may experience abdominal discomfort such as a feeling of fullness or "gas" pains.  FOLLOW-UP Your doctor will discuss the results of your test with you.  SEEK IMMEDIATE MEDICAL ATTENTION IF ANY OF THE FOLLOWING OCCUR: Excessive nausea (feeling sick to your stomach) and/or vomiting.  Severe abdominal pain and distention (swelling).  Trouble swallowing.  Temperature over 101 F (37.8 C).  Rectal bleeding or vomiting of blood.    Stop using high dose aspirin  including Goody/BC powders, NSAIDs such as Aleve, ibuprofen , naproxen, Motrin , Voltaren or Advil  (even the topical ones) for atleast next 14 days      I hope you have a great rest of your week!   Rhema Boyett Faizan Paizley Ramella , M.D.. Gastroenterology and Hepatology [facility]

## 2024-02-19 NOTE — Transfer of Care (Signed)
 Immediate Anesthesia Transfer of Care Note  Patient: Jillian Cruz  Procedure(s) Performed: COLONOSCOPY  Patient Location: Endoscopy Unit  Anesthesia Type:General  Level of Consciousness: awake, alert , oriented, and patient cooperative  Airway & Oxygen Therapy: Patient Spontanous Breathing  Post-op Assessment: Report given to RN, Post -op Vital signs reviewed and stable, and Patient moving all extremities X 4  Post vital signs: Reviewed and stable  Last Vitals:  Vitals Value Taken Time  BP 119/62 02/19/24 12:35  Temp 36.4 C 02/19/24 12:35  Pulse 77 02/19/24 12:35  Resp 20 02/19/24 12:35  SpO2 100 % 02/19/24 12:35    Last Pain:  Vitals:   02/19/24 1235  TempSrc: Oral  PainSc: 0-No pain         Complications: No notable events documented.

## 2024-02-19 NOTE — Op Note (Signed)
 [facility] Patient Name: Jillian Cruz Procedure Date: 02/19/2024 11:15 AM MRN: [id number] Date of Birth: 05-19-1971 Attending MD: Deatrice Dine , MD, [id number] CSN: [id number] Age: 53 Admit Type: Outpatient Procedure:                Colonoscopy Indications:              Screening for colorectal malignant neoplasm Providers:                Deatrice Dine, MD, Harlene Lips, Dorcas Lenis, Technician Referring MD:              Medicines:                Monitored Anesthesia Care Complications:            No immediate complications. Estimated Blood Loss:     Estimated blood loss was minimal. Procedure:                Pre-Anesthesia Assessment:                           - Prior to the procedure, a History and Physical                            was performed, and patient medications and                            allergies were reviewed. The patient's tolerance of                            previous anesthesia was also reviewed. The risks                            and benefits of the procedure and the sedation                            options and risks were discussed with the patient.                            All questions were answered, and informed consent                            was obtained. Prior Anticoagulants: The patient has                            taken no anticoagulant or antiplatelet agents                            except for NSAID medication. ASA Grade Assessment:                            II - A patient with mild systemic disease. After  reviewing the risks and benefits, the patient was                            deemed in satisfactory condition to undergo the                            procedure.                           After obtaining informed consent, the colonoscope                            was passed under direct vision. Throughout the                            procedure, the patient's  blood pressure, pulse, and                            oxygen saturations were monitored continuously. The                            [id number]) scope was introduced through                            the anus and advanced to the the cecum, identified                            by appendiceal orifice and ileocecal valve. The                            colonoscopy was performed without difficulty. The                            patient tolerated the procedure well. The quality                            of the bowel preparation was evaluated using the                            BBPS [facility] Bowel Preparation Scale) with scores                            of: Right Colon = 3, Transverse Colon = 3 and Left                            Colon = 3 (entire mucosa seen well with no residual                            staining, small fragments of stool or opaque                            liquid). The total BBPS score equals 9. The  ileocecal valve, appendiceal orifice, and rectum                            were photographed. Scope In: 11:46:20 AM Scope Out: 12:29:30 PM Scope Withdrawal Time: 0 hours 38 minutes 31 seconds  Total Procedure Duration: 0 hours 43 minutes 10 seconds  Findings:      The perianal and digital rectal examinations were normal.      A 15 mm polyp was found in the proximal transverse colon. The polyp was       pedunculated. Area was successfully injected with 1 mL of a 0.1 mg/mL       solution of epinephrine  for to prevent bleeding given stalk. The polyp       was removed with a hot snare. Resection and retrieval were complete.      A 10 mm polyp was found in the distal transverse colon. The polyp was       pedunculated. The polyp was removed with a hot snare. Resection and       retrieval were complete. For hemostasis, one hemostatic clip was       successfully placed (MR conditional). Clip manufacturer: [employer]. There was no  bleeding at the end of the procedure.      Three sessile polyps were found in the sigmoid colon, descending colon       and hepatic flexure. The polyps were 5 to 8 mm in size. These polyps       were removed with a cold snare. Resection and retrieval were complete.      Non-bleeding external hemorrhoids were found during endoscopy. The       hemorrhoids were small. Impression:               - One 15 mm polyp in the proximal transverse colon,                            removed with a hot snare. Resected and retrieved.                            Injected.                           - One 10 mm polyp in the distal transverse colon,                            removed with a hot snare. Resected and retrieved.                            Clip (MR conditional) was placed. Clip                            manufacturer: [employer].                           - Three 5 to 8 mm polyps in the sigmoid colon, in                            the descending colon and at the hepatic flexure,  removed with a cold snare. Resected and retrieved.                           - Non-bleeding external hemorrhoids. Moderate Sedation:      Per Anesthesia Care Recommendation:           - Patient has a contact number available for                            emergencies. The signs and symptoms of potential                            delayed complications were discussed with the                            patient. Return to normal activities tomorrow.                            Written discharge instructions were provided to the                            patient.                           - Resume previous diet.                           - Continue present medications.                           - Await pathology results.                           - Repeat colonoscopy in 3 years for surveillance                            based on pathology results.                           - Return to  primary care physician as previously                            scheduled. Procedure Code(s):        --- Professional ---                           [phone], Colonoscopy, flexible; with removal of                            tumor(s), polyp(s), or other lesion(s) by snare                            technique Diagnosis Code(s):        --- Professional ---                           Z12.11, Encounter for screening for malignant  neoplasm of colon                           D12.3, Benign neoplasm of transverse colon (hepatic                            flexure or splenic flexure)                           D12.5, Benign neoplasm of sigmoid colon                           D12.4, Benign neoplasm of descending colon                           K64.4, Residual hemorrhoidal skin tags CPT copyright 2022 American Medical Association. All rights reserved. The codes documented in this report are preliminary and upon coder review may  be revised to meet current compliance requirements. Deatrice Dine, MD Deatrice Dine, MD 02/19/2024 12:51:34 PM This report has been signed electronically. Number of Addenda: 0

## 2024-02-19 NOTE — Anesthesia Postprocedure Evaluation (Signed)
 Anesthesia Post Note  Patient: Jillian Cruz  Procedure(s) Performed: COLONOSCOPY  Patient location during evaluation: Endoscopy Anesthesia Type: General Level of consciousness: awake and alert Pain management: pain level controlled Vital Signs Assessment: post-procedure vital signs reviewed and stable Respiratory status: spontaneous breathing, nonlabored ventilation, respiratory function stable and patient connected to nasal cannula oxygen Cardiovascular status: blood pressure returned to baseline and stable Postop Assessment: no apparent nausea or vomiting Anesthetic complications: no   There were no known notable events for this encounter.   Last Vitals:  Vitals:   02/19/24 1128 02/19/24 1235  BP: (!) 143/97 119/62  Pulse: 84 77  Resp: 17 20  Temp: 37.1 C (!) 36.4 C  SpO2: 96% 100%    Last Pain:  Vitals:   02/19/24 1235  TempSrc: Oral  PainSc: 0-No pain                 Fordyce Lepak L Karyl Sharrar

## 2024-02-19 NOTE — H&P (Signed)
 Primary Care Physician:  Marvine Rush, MD Primary Gastroenterologist:  Dr. Cinderella  Pre-Procedure History & Physical: HPI:  Jillian Cruz is a 53 y.o. female is here for a colonoscopy for colon cancer screening purposes.  Patient denies any family history of colorectal cancer.  No melena or hematochezia.  No abdominal pain or unintentional weight loss.  No change in bowel habits.  Overall feels well from a GI standpoint.  Past Medical History:  Diagnosis Date   ANA positive    Chronic joint pain    all joints   Dyslipidemia    Dysmenorrhea    Endometrial polyp    Family history of cardiac disorder    H/O pleurisy    intermittant due to lupus   Heart murmur    History of cardiovascular stress test    2009  per dr mona note (cardiologist) --  showed fixed anterior defect consistant w/ breast attenuation artifact, exercise capacity limited bp remarkably elevated to 223/84   History of esophagitis    erosive esophagitis and duodenitis 2014   History of Helicobacter pylori infection    2014   History of recurrent pneumonia    Lupus (systemic lupus erythematosus) (HCC) dx 2011 --  ANA positive/  dsDNA   rheumatologist-  dr patal/ dr rozella [facility])  lov in epic under care everywhere   Menorrhagia    PONV (postoperative nausea and vomiting)    Raynaud disease 2011   Recurrent oral ulcers    due to lupus   Wears contact lenses     Past Surgical History:  Procedure Laterality Date   BREAST BIOPSY Right 12/2018   BENIGN BREAST PARENCHYMA WITH FIBROCYSTIC CHANGE, INCLUDING FIBROADENOMATOID CHANGE AND PSEUDO-ANGIOMATOUS STROMAL HYPERPLASIA (PASH)   CESAREAN SECTION  08/06/2001   DILITATION & CURRETTAGE/HYSTROSCOPY WITH NOVASURE ABLATION N/A 04/05/2016   Procedure: DILATATION & CURETTAGE/HYSTEROSCOPY WITH NOVASURE ABLATION;  Surgeon: Charlie Croak, MD;  Location: [facility] ;  Service: Gynecology;  Laterality: N/A;   ESOPHAGOGASTRODUODENOSCOPY N/A 09/03/2012   Procedure:  ESOPHAGOGASTRODUODENOSCOPY (EGD);  Surgeon: Claudis RAYMOND Rivet, MD;  Location: [facility];  Service: Endoscopy;  Laterality: N/A;  325   LAPAROSCOPIC CHOLECYSTECTOMY  06/07/2010   LAPAROSCOPIC TUBAL LIGATION Bilateral 04/05/2016   Procedure: LAPAROSCOPIC TUBAL LIGATION with filshie clips;  Surgeon: Charlie Croak, MD;  Location: [facility];  Service: Gynecology;  Laterality: Bilateral;   NEGATIVE SLEEP STUDY  2010  per pt   TRANSTHORACIC ECHOCARDIOGRAM  12/08/2010   ef 55-60%/  mild MR/  mild LAE    Prior to Admission medications   Medication Sig Start Date End Date Taking? Authorizing Provider  ibuprofen  (ADVIL ) 200 MG tablet Take 4 tablets (800 mg total) by mouth every 8 (eight) hours as needed for moderate pain. 04/05/16  Yes Croak Charlie, MD    Allergies as of 01/16/2024 - Review Complete 01/08/2024  Allergen Reaction Noted   Bee venom Anaphylaxis 04/03/2016    Family History  Problem Relation Age of Onset   Heart disease Father    Kidney cancer Father     Social History   Socioeconomic History   Marital status: Divorced    Spouse name: Not on file   Number of children: Not on file   Years of education: Not on file   Highest education level: Not on file  Occupational History   Occupation: Firefighter  Tobacco Use   Smoking status: Every Day    Current packs/day: 1.00    Average packs/day: 1 pack/day  for 29.0 years (29.0 ttl pk-yrs)    Types: Cigarettes   Smokeless tobacco: Never  Vaping Use   Vaping status: Some Days  Substance and Sexual Activity   Alcohol use: No   Drug use: No   Sexual activity: Not on file  Other Topics Concern   Not on file  Social History Narrative   Not on file   Social Drivers of Health   Financial Resource Strain: Not on file  Food Insecurity: Not on file  Transportation Needs: Not on file  Physical Activity: Not on file  Stress: Not on file  Social Connections: Not on file  Intimate Partner Violence: Not  on file    Review of Systems: See HPI, otherwise negative ROS  Physical Exam: Vital signs in last 24 hours:     General:   Alert,  Well-developed, well-nourished, pleasant and cooperative in NAD Head:  Normocephalic and atraumatic. Eyes:  Sclera clear, no icterus.   Conjunctiva pink. Ears:  Normal auditory acuity. Nose:  No deformity, discharge,  or lesions. Msk:  Symmetrical without gross deformities. Normal posture. Extremities:  Without clubbing or edema. Neurologic:  Alert and  oriented x4;  grossly normal neurologically. Skin:  Intact without significant lesions or rashes. Psych:  Alert and cooperative. Normal mood and affect.  Impression/Plan: Jillian Cruz is here for a colonoscopy to be performed for colon cancer screening purposes.  The risks of the procedure including infection, bleed, or perforation as well as benefits, limitations, alternatives and imponderables have been reviewed with the patient. Questions have been answered. All parties agreeable.

## 2024-02-20 ENCOUNTER — Encounter (HOSPITAL_COMMUNITY): Payer: Self-pay | Admitting: Gastroenterology

## 2024-02-20 LAB — SURGICAL PATHOLOGY

## 2024-02-24 ENCOUNTER — Ambulatory Visit (INDEPENDENT_AMBULATORY_CARE_PROVIDER_SITE_OTHER): Payer: Self-pay | Admitting: Gastroenterology

## 2024-02-25 NOTE — Progress Notes (Signed)
 3 yr TCS noted in recall Patient result letter mailed procedure note and pathology result faxed to PCP

## 2024-04-22 ENCOUNTER — Encounter (INDEPENDENT_AMBULATORY_CARE_PROVIDER_SITE_OTHER): Payer: Self-pay | Admitting: Gastroenterology

## 2024-06-30 ENCOUNTER — Ambulatory Visit
Admission: RE | Admit: 2024-06-30 | Discharge: 2024-06-30 | Disposition: A | Discharge status: Home / Self Care | Source: Ambulatory Visit | Attending: Family Medicine | Admitting: Family Medicine

## 2024-06-30 ENCOUNTER — Other Ambulatory Visit: Payer: Self-pay

## 2024-06-30 VITALS — BP 147/87 | HR 81 | Temp 98.7°F | Resp 20

## 2024-06-30 DIAGNOSIS — J3089 Other allergic rhinitis: Secondary | ICD-10-CM | POA: Diagnosis not present

## 2024-06-30 DIAGNOSIS — J329 Chronic sinusitis, unspecified: Secondary | ICD-10-CM | POA: Diagnosis not present

## 2024-06-30 DIAGNOSIS — B9789 Other viral agents as the cause of diseases classified elsewhere: Secondary | ICD-10-CM

## 2024-06-30 DIAGNOSIS — H6993 Unspecified Eustachian tube disorder, bilateral: Secondary | ICD-10-CM

## 2024-06-30 MED ORDER — DEXAMETHASONE SOD PHOSPHATE PF 10 MG/ML IJ SOLN
10.0000 mg | Freq: Once | INTRAMUSCULAR | Status: AC
  Administered 2024-06-30: 10 mg via INTRAMUSCULAR

## 2024-06-30 MED ORDER — AZELASTINE HCL 0.1 % NA SOLN: 1.0000 | Freq: Two times a day (BID) | NASAL | 0 refills | Status: AC

## 2024-06-30 NOTE — Discharge Instructions (Signed)
 We have given you a steroid injection today which I feel will significantly help your symptoms.  Continue the Zyrtec, add the Astelin  twice daily and you may additionally take Flonase up to twice daily.  Use saline sinus rinses, humidifiers, decongestants as needed such as Coricidin HBP.  Follow-up for worsening or unresolving symptoms.

## 2024-06-30 NOTE — ED Provider Notes (Signed)
 " [facility]    CSN: [id number] Arrival date & time: 06/30/24  9045      History   Chief Complaint Chief Complaint  Patient presents with   Ear Fullness    Entered by patient    HPI Jillian Cruz is a 53 y.o. female.    Pt reports bilateral ear fullness for last several weeks and reports facial pressure started this am. Denies any known fevers. Has tried otc medication with minimal effect on symptoms.       Past Medical History:  Diagnosis Date   ANA positive    Chronic joint pain    all joints   Dyslipidemia    Dysmenorrhea    Endometrial polyp    Family history of cardiac disorder    H/O pleurisy    intermittant due to lupus   Heart murmur    History of cardiovascular stress test    2009  per dr mona note (cardiologist) --  showed fixed anterior defect consistant w/ breast attenuation artifact, exercise capacity limited bp remarkably elevated to 223/84   History of esophagitis    erosive esophagitis and duodenitis 2014   History of Helicobacter pylori infection    2014   History of recurrent pneumonia    Lupus (systemic lupus erythematosus) (HCC) dx 2011 --  ANA positive/  dsDNA   rheumatologist-  dr patal/ dr rozella [facility])  lov in epic under care everywhere   Menorrhagia    PONV (postoperative nausea and vomiting)    Raynaud disease 2011   Recurrent oral ulcers    due to lupus   Wears contact lenses     Patient Active Problem List   Diagnosis Date Noted   Adenomatous polyp of transverse colon 02/19/2024   Pulmonary fibrosis (HCC) 11/29/2020   Tobacco dependence 05/18/2013   Dyslipidemia 05/18/2013   Lupus (systemic lupus erythematosus) (HCC) 05/18/2013   Obesity (BMI 35.0-39.9 without comorbidity) 05/18/2013   H. pylori infection 09/01/2012   Dyspnea 11/14/2010   Lupus anticoagulant disorder 11/14/2010    Past Surgical History:  Procedure Laterality Date   BREAST BIOPSY Right 12/2018   BENIGN BREAST PARENCHYMA WITH FIBROCYSTIC  CHANGE, INCLUDING FIBROADENOMATOID CHANGE AND PSEUDO-ANGIOMATOUS STROMAL HYPERPLASIA (PASH)   CESAREAN SECTION  08/06/2001   COLONOSCOPY N/A 02/19/2024   Procedure: COLONOSCOPY;  Surgeon: Cinderella Deatrice FALCON, MD;  Location: [facility];  Service: Endoscopy;  Laterality: N/A;  12:30 pm, asa 1/2   DILITATION & CURRETTAGE/HYSTROSCOPY WITH NOVASURE ABLATION N/A 04/05/2016   Procedure: DILATATION & CURETTAGE/HYSTEROSCOPY WITH NOVASURE ABLATION;  Surgeon: Charlie Croak, MD;  Location: [facility];  Service: Gynecology;  Laterality: N/A;   ESOPHAGOGASTRODUODENOSCOPY N/A 09/03/2012   Procedure: ESOPHAGOGASTRODUODENOSCOPY (EGD);  Surgeon: Claudis RAYMOND Rivet, MD;  Location: [facility];  Service: Endoscopy;  Laterality: N/A;  325   LAPAROSCOPIC CHOLECYSTECTOMY  06/07/2010   LAPAROSCOPIC TUBAL LIGATION Bilateral 04/05/2016   Procedure: LAPAROSCOPIC TUBAL LIGATION with filshie clips;  Surgeon: Charlie Croak, MD;  Location: [facility];  Service: Gynecology;  Laterality: Bilateral;   NEGATIVE SLEEP STUDY  2010  per pt   TRANSTHORACIC ECHOCARDIOGRAM  12/08/2010   ef 55-60%/  mild MR/  mild LAE    OB History   No obstetric history on file.      Home Medications    Prior to Admission medications  Medication Sig Start Date End Date Taking? Authorizing Provider  azelastine  (ASTELIN ) 0.1 % nasal spray Place 1 spray into both nostrils 2 (two)  times daily. Use in each nostril as directed 06/30/24  Yes Stuart Vernell Norris, PA-C    Family History Family History  Problem Relation Age of Onset   Heart disease Father    Kidney cancer Father     Social History Social History[1]   Allergies   Bee venom   Review of Systems Review of Systems PER HPI  Physical Exam Triage Vital Signs ED Triage Vitals  Encounter Vitals Group     BP 06/30/24 1018 (!) 147/87     Girls Systolic BP Percentile --      Girls Diastolic BP Percentile --      Boys Systolic BP  Percentile --      Boys Diastolic BP Percentile --      Pulse Rate 06/30/24 1018 81     Resp 06/30/24 1018 20     Temp 06/30/24 1018 98.7 F (37.1 C)     Temp Source 06/30/24 1018 Oral     SpO2 06/30/24 1018 94 %     Weight --      Height --      Head Circumference --      Peak Flow --      Pain Score 06/30/24 1016 5     Pain Loc --      Pain Education --      Exclude from Growth Chart --    No data found.  Updated Vital Signs BP (!) 147/87 (BP Location: Right Arm)   Pulse 81   Temp 98.7 F (37.1 C) (Oral)   Resp 20   SpO2 94%   Visual Acuity Right Eye Distance:   Left Eye Distance:   Bilateral Distance:    Right Eye Near:   Left Eye Near:    Bilateral Near:     Physical Exam Vitals and nursing note reviewed.  Constitutional:      Appearance: Normal appearance.  HENT:     Head: Atraumatic.     Right Ear: External ear normal.     Left Ear: External ear normal.     Ears:     Comments: Moderate bilateral middle ear effusions    Nose: Rhinorrhea present.     Mouth/Throat:     Mouth: Mucous membranes are moist.     Pharynx: Posterior oropharyngeal erythema present.  Eyes:     Extraocular Movements: Extraocular movements intact.     Conjunctiva/sclera: Conjunctivae normal.  Cardiovascular:     Rate and Rhythm: Normal rate and regular rhythm.     Heart sounds: Normal heart sounds.  Pulmonary:     Effort: Pulmonary effort is normal.     Breath sounds: Normal breath sounds. No wheezing or rales.  Musculoskeletal:        General: Normal range of motion.     Cervical back: Normal range of motion and neck supple.  Skin:    General: Skin is warm and dry.  Neurological:     Mental Status: She is alert and oriented to person, place, and time.  Psychiatric:        Mood and Affect: Mood normal.        Thought Content: Thought content normal.      UC Treatments / Results  Labs (all labs ordered are listed, but only abnormal results are displayed) Labs  Reviewed - No data to display  EKG   Radiology No results found.  Procedures Procedures (including critical care time)  Medications Ordered in UC Medications  dexamethasone  (DECADRON ) injection 10  mg (10 mg Intramuscular Given 06/30/24 1038)    Initial Impression / Assessment and Plan / UC Course  I have reviewed the triage vital signs and the nursing notes.  Pertinent labs & imaging results that were available during my care of the patient were reviewed by me and considered in my medical decision making (see chart for details).     Mildly hypertensive in triage, otherwise vital signs within normal limits.  She is overall well-appearing and in no acute distress.  Suspect viral sinusitis and seasonal allergies causing bilateral eustachian tube dysfunction.  Will treat with IM Decadron , Astelin , Zyrtec, supportive over-the-counter medications and home care.  Return for worsening or unresolving symptoms.  No evidence of bacterial infection today.  Final Clinical Impressions(s) / UC Diagnoses   Final diagnoses:  Viral sinusitis  Seasonal allergic rhinitis due to other allergic trigger  Dysfunction of Eustachian tube, bilateral     Discharge Instructions      We have given you a steroid injection today which I feel will significantly help your symptoms.  Continue the Zyrtec, add the Astelin  twice daily and you may additionally take Flonase up to twice daily.  Use saline sinus rinses, humidifiers, decongestants as needed such as Coricidin HBP.  Follow-up for worsening or unresolving symptoms.    ED Prescriptions     Medication Sig Dispense Auth. Provider   azelastine  (ASTELIN ) 0.1 % nasal spray Place 1 spray into both nostrils 2 (two) times daily. Use in each nostril as directed 30 mL Stuart Vernell Norris, PA-C      PDMP not reviewed this encounter.    [1]  Social History Tobacco Use   Smoking status: Former    Current packs/day: 1.00    Average packs/day: 1  pack/day for 29.0 years (29.0 ttl pk-yrs)    Types: Cigarettes   Smokeless tobacco: Never  Vaping Use   Vaping status: Some Days  Substance Use Topics   Alcohol use: No   Drug use: No     Stuart Vernell Norris, PA-C 06/30/24 1046  "

## 2024-06-30 NOTE — ED Triage Notes (Signed)
 Pt reports bilateral ear fullness for last several weeks and reports facial pressure started this am. Denies any known fevers. Has tried otc medication with minimal effect on symptoms.
